# Patient Record
Sex: Male | Born: 1985
Health system: Southern US, Community
[De-identification: ages and names within clinical notes are randomized; demographics above are authoritative.]

## PROBLEM LIST (undated history)

## (undated) ENCOUNTER — Emergency Department (HOSPITAL_COMMUNITY): Payer: BLUE CROSS/BLUE SHIELD | Source: Home / Self Care

## (undated) DIAGNOSIS — K219 Gastro-esophageal reflux disease without esophagitis: Secondary | ICD-10-CM

## (undated) DIAGNOSIS — E349 Endocrine disorder, unspecified: Secondary | ICD-10-CM

## (undated) DIAGNOSIS — K602 Anal fissure, unspecified: Secondary | ICD-10-CM

## (undated) HISTORY — PX: ESOPHAGOGASTRODUODENOSCOPY: SHX1529

## (undated) HISTORY — DX: Anal fissure, unspecified: K60.2

## (undated) HISTORY — DX: Endocrine disorder, unspecified: E34.9

## (undated) HISTORY — DX: Gastro-esophageal reflux disease without esophagitis: K21.9

---

## 2007-03-21 ENCOUNTER — Encounter: Admission: RE | Admit: 2007-03-21 | Discharge: 2007-03-21 | Payer: Self-pay | Admitting: Internal Medicine

## 2008-04-17 ENCOUNTER — Ambulatory Visit (HOSPITAL_BASED_OUTPATIENT_CLINIC_OR_DEPARTMENT_OTHER): Admission: RE | Admit: 2008-04-17 | Discharge: 2008-04-17 | Payer: Self-pay | Admitting: Orthopedic Surgery

## 2011-04-20 NOTE — Op Note (Signed)
[name], Tyler Howe               ACCOUNT NO.:  [id number]   MEDICAL RECORD NO.:  [id number]           PATIENT TYPE:   LOCATION:                                 FACILITY:   PHYSICIAN:  Artist Pais. Weingold, M.D.DATE OF BIRTH:  1986-02-11   DATE OF PROCEDURE:  04/17/2008  DATE OF DISCHARGE:                               OPERATIVE REPORT   PREOPERATIVE DIAGNOSIS:  Right fifth metacarpal fracture.   POSTOPERATIVE DIAGNOSIS:  Right fifth metacarpal fracture.   PROCEDURE:  Open reduction and internal fixation of above with 2.0-mm  lag screws and single #1 as well as 4-hole 2.0-mm plate and 4 screws.   SURGEON:  Artist Pais. Mina Marble, MD   ASSISTANT:  None.   ANESTHESIA:  General.   TOURNIQUET TIME:  48 minutes.   COMPLICATIONS:  None.   DRAINS:  None.   OPERATIVE REPORT:  The patient was taken to the operating suite.  After  induction of adequate general anesthesia, the right upper extremity was  prepped and draped in usual sterile fashion.  An Esmarch was used to  exsanguinate the limb.  Tourniquet was inflated to 250 mmHg.  At this  point in time, a small incision was made at the base of the fifth  metacarpal and iron rod was introduced into the intramedullary canal  from proximal and distal, and was attempted to be driven across the  fracture site.  However, due to severe comminution and angulation, this  was not able to be done.  The incision was then extended longitudinally  over the shaft metacarpal.  Subperiosteal dissection was carried out to  the fracture site and fracture site was debrided off, reduced with  reduction clamp.  Provisional fixation with a 2.0-mm lag screw was  undertaken followed by a 4-hole 2.0-mm plate and 4 screws for final  fixation.  The wound was then thoroughly irrigated, was closed in layer  with a 4-0 Vicryl to the muscle and periosteum, and a 4-0 Vicryl Rapide  suture subcuticularly with Steri-Strips, 4 x 4 fluffs, and a volar  splint was  applied.  Intraoperative x-ray showed good reduction in both  deep and lateral plane.  The patient tolerated the procedure well and  went to recovery room in stable fashion.      Artist Pais Mina Marble, M.D.  Electronically Signed    MAW/MEDQ  D:  05/15/2008  T:  05/16/2008  Job:  [id number]

## 2013-02-06 ENCOUNTER — Other Ambulatory Visit (HOSPITAL_COMMUNITY): Payer: Self-pay | Admitting: Sports Medicine

## 2013-02-06 DIAGNOSIS — M25511 Pain in right shoulder: Secondary | ICD-10-CM

## 2013-02-12 ENCOUNTER — Ambulatory Visit (HOSPITAL_COMMUNITY)
Admission: RE | Admit: 2013-02-12 | Discharge: 2013-02-12 | Disposition: A | Discharge status: Home / Self Care | Payer: BC Managed Care – PPO | Source: Ambulatory Visit | Attending: Sports Medicine | Admitting: Sports Medicine

## 2013-02-12 DIAGNOSIS — M25519 Pain in unspecified shoulder: Secondary | ICD-10-CM | POA: Insufficient documentation

## 2013-02-12 DIAGNOSIS — M25511 Pain in right shoulder: Secondary | ICD-10-CM

## 2013-02-12 DIAGNOSIS — S4980XA Other specified injuries of shoulder and upper arm, unspecified arm, initial encounter: Secondary | ICD-10-CM | POA: Insufficient documentation

## 2013-02-12 DIAGNOSIS — M19019 Primary osteoarthritis, unspecified shoulder: Secondary | ICD-10-CM | POA: Insufficient documentation

## 2013-02-12 DIAGNOSIS — M25569 Pain in unspecified knee: Secondary | ICD-10-CM | POA: Insufficient documentation

## 2013-02-12 DIAGNOSIS — X58XXXA Exposure to other specified factors, initial encounter: Secondary | ICD-10-CM | POA: Insufficient documentation

## 2013-02-12 DIAGNOSIS — S46909A Unspecified injury of unspecified muscle, fascia and tendon at shoulder and upper arm level, unspecified arm, initial encounter: Secondary | ICD-10-CM | POA: Insufficient documentation

## 2013-02-12 MED ORDER — GADOBENATE DIMEGLUMINE 529 MG/ML IV SOLN
5.0000 mL | Freq: Once | INTRAVENOUS | Status: AC | PRN
  Administered 2013-02-12: 0.1 mL via INTRAVENOUS

## 2013-02-12 MED ORDER — IOHEXOL 180 MG/ML  SOLN
20.0000 mL | Freq: Once | INTRAMUSCULAR | Status: AC | PRN
  Administered 2013-02-12: 7 mL via INTRA_ARTICULAR

## 2013-02-12 NOTE — Procedures (Addendum)
*  RADIOLOGY REPORT* Clinical Data: [Weight lifting injury 5 months ago.  Since that time, when the patient placed weight on the shoulder, he experiences pain.] Fluoroscopically guided right glenohumeral joint injection, pre MRI Comparison: [None] Fluoro time:  1.3 minutes of the lowest setting intermittent fluoro. Findings: [I discussed the risks (including hemorrhage and infection, among others), benefits, and alternatives to fluoroscopically guided right glenohumeral joint injection with MRI contrast medium with Mr. Bi.  We discussed the high likelihood of technical success of the procedure.  The patient understood and elected to undergo the procedure. Following sterile skin prep and local anesthetic administration consisting of 1% lidocaine, a 22 gauge spinal needle was advanced without difficulty into the right glenohumeral joint under fluoroscopic guidance.  A total of 12 ml of a mixture of 8 ml lidocaine, 8 ml Omnipaque-300, and 0.1 ml Multihance was then injected into the joint.  The needle was removed and the skin cleansed and bandaged.  No immediate complications were observed.  The patient was taken to MRI for scanning.] IMPRESSION: [ 1.  Technically successful fluoroscopically guided right glenohumeral joint contrast injection, pre MRI.  ADDENDUM:  The contrast utilized was Omnipaque-180, not Omnipaque-300.]

## 2014-11-05 ENCOUNTER — Other Ambulatory Visit: Payer: Self-pay | Admitting: Gastroenterology

## 2014-11-05 DIAGNOSIS — K219 Gastro-esophageal reflux disease without esophagitis: Secondary | ICD-10-CM

## 2014-11-05 DIAGNOSIS — R1012 Left upper quadrant pain: Secondary | ICD-10-CM

## 2014-11-11 ENCOUNTER — Ambulatory Visit
Admission: RE | Admit: 2014-11-11 | Discharge: 2014-11-11 | Disposition: A | Discharge status: Home / Self Care | Payer: BC Managed Care – PPO | Source: Ambulatory Visit | Attending: Gastroenterology | Admitting: Gastroenterology

## 2014-11-11 DIAGNOSIS — R1012 Left upper quadrant pain: Secondary | ICD-10-CM

## 2014-11-11 DIAGNOSIS — K219 Gastro-esophageal reflux disease without esophagitis: Secondary | ICD-10-CM

## 2016-04-08 ENCOUNTER — Telehealth: Payer: Self-pay

## 2016-04-08 ENCOUNTER — Ambulatory Visit (INDEPENDENT_AMBULATORY_CARE_PROVIDER_SITE_OTHER): Payer: BLUE CROSS/BLUE SHIELD | Admitting: Family Medicine

## 2016-04-08 VITALS — BP 122/82 | HR 67 | Temp 98.0°F | Resp 17 | Ht 76.0 in | Wt 235.0 lb

## 2016-04-08 DIAGNOSIS — R5383 Other fatigue: Secondary | ICD-10-CM

## 2016-04-08 DIAGNOSIS — J029 Acute pharyngitis, unspecified: Secondary | ICD-10-CM | POA: Diagnosis not present

## 2016-04-08 LAB — POCT RAPID STREP A (OFFICE): Rapid Strep A Screen: NEGATIVE

## 2016-04-08 MED ORDER — CEPHALEXIN 500 MG PO CAPS
500.0000 mg | ORAL_CAPSULE | Freq: Three times a day (TID) | ORAL | Status: DC
Start: 2016-04-08 — End: 2016-05-31

## 2016-04-08 NOTE — Telephone Encounter (Signed)
Pt has more questions about his visit with dr Milus Glazierlauenstein today he wants to make sure that his diagnosis will not be given to his 66month old  Best number [phone]

## 2016-04-08 NOTE — Patient Instructions (Addendum)
The initial strep test was negative but this is sometimes a false negative. I'm running a backup test which should be back in a couple days. Meantime, you start on Keflex 3 times a day.

## 2016-04-08 NOTE — Progress Notes (Signed)
Is a 30 year old male gentleman who works as a [employer] nearby. He's married and has a 931-month-old son.  He presents today with 3 days of progressive sore throat and fatigue. He added drenching night sweats and first night of his illness.  Patient does not have known fever, cough, intestinal upset  Objective:BP 122/82 mmHg  Pulse 67  Temp(Src) 98 F (36.7 C) (Oral)  Resp 17  Ht 6\' 4"  (1.93 m)  Wt 235 lb (106.595 kg)  BMI 28.62 kg/m2  SpO2 97% Examination of the pharynx reveals diffuse marked erythema with no exudates TMs and canals are normal Neck: Supple without adenopathy Skin: No rash  Results for orders placed or performed in visit on 04/08/16  POCT rapid strep A  Result Value Ref Range   Rapid Strep A Screen Negative Negative   Assessment: Patient has significant pharyngitis. This may represent a strep infection and we need to rule out a backup test since the rapid test to sometimes erroneous.  Plan: Throat culture, Keflex 500 3 times a day in the meantime  Signed, Elvina SidleKurt Lya Holben M.D.

## 2016-04-10 LAB — CULTURE, GROUP A STREP: Organism ID, Bacteria: NORMAL

## 2016-05-31 ENCOUNTER — Ambulatory Visit (INDEPENDENT_AMBULATORY_CARE_PROVIDER_SITE_OTHER): Payer: BLUE CROSS/BLUE SHIELD | Admitting: Family Medicine

## 2016-05-31 VITALS — BP 122/72 | HR 86 | Temp 98.2°F | Resp 17 | Ht 76.5 in | Wt 236.0 lb

## 2016-05-31 DIAGNOSIS — J019 Acute sinusitis, unspecified: Secondary | ICD-10-CM

## 2016-05-31 DIAGNOSIS — J069 Acute upper respiratory infection, unspecified: Secondary | ICD-10-CM

## 2016-05-31 MED ORDER — AMOXICILLIN-POT CLAVULANATE 875-125 MG PO TABS: 1.0000 | ORAL_TABLET | Freq: Two times a day (BID) | ORAL | Status: DC

## 2016-05-31 NOTE — Progress Notes (Signed)
Subjective:  By signing my name below, I, Tyler Howe, attest that this documentation has been prepared under the direction and in the presence of Meredith StaggersJeffrey Mauria Asquith, MD.  Electronically Signed: Andrew Auaven Howe, ED Scribe. 05/31/2016. 10:06 AM.  Patient ID: Tyler Howe, male    DOB: January 28, 1986, 30 y.o.   MRN: [id number]  HPI   Chief Complaint  Patient presents with  . Sinusitis  . Nasal Congestion    HPI Comments: Tyler Howe is a 30 y.o. male who presents to the [facility] complaining of sinus congestion that began 2 days ago. Pt reports initial symptom of sore throat 4 days ago that resolved 2 days later. He developed green nasal drainage along with sinus pressure and night sweats 2 days ago, but no measured fever. Pt has tried OTC cold and sinus medication and saline spray as needed. Trying to consume plenty of food and fluids. He has sick contacts at home of his wife as well as a 4 m.o who also has a cough and congestion. Pt plans to go to the beach at the end of the week. Pt denies abx allergies.   Pt also notes left calf pain for the past 5 years.  He used to be a runner but has stopped due to calf cramping. He has been followed by podiatrist and PT in the past but problem has not resolved.   Pt works at a [employer]. 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   There are no active problems to display for this patient.  No past medical history on file. No past surgical history on file. No Known Allergies Prior to Admission medications   Not on File   Social History   Social History  . Marital Status: Married    Spouse Name: N/A  . Number of Children: N/A  . Years of Education: N/A   Occupational History  . Not on file.   Social History Main Topics  . Smoking status: Never Smoker   . Smokeless tobacco: Not on file  . Alcohol Use: No  . Drug Use: No  . Sexual Activity: Not on file   Other Topics Concern  . Not on file   Social History Narrative     Review of Systems  Constitutional: Positive for diaphoresis. Negative for fever, chills and appetite change.  HENT: Positive for congestion, sinus pressure and sore throat (initial, resolved).        Objective:   Physical Exam  Constitutional: He is oriented to person, place, and time. He appears well-developed and well-nourished.  HENT:  Head: Normocephalic and atraumatic.  Right Ear: Tympanic membrane, external ear and ear canal normal.  Left Ear: Tympanic membrane, external ear and ear canal normal.  Nose: No rhinorrhea. Right sinus exhibits no maxillary sinus tenderness and no frontal sinus tenderness. Left sinus exhibits no maxillary sinus tenderness and no frontal sinus tenderness.  Mouth/Throat: Oropharynx is clear and moist and mucous membranes are normal. No oropharyngeal exudate or posterior oropharyngeal erythema.  Eyes: Conjunctivae are normal. Pupils are equal, round, and reactive to light.  Neck: Neck supple.  Cardiovascular: Normal rate, regular rhythm, normal heart sounds and intact distal pulses.   No murmur heard. Pulmonary/Chest: Effort normal and breath sounds normal. He has no wheezes. He has no rhonchi. He has no rales.  Abdominal: Soft. There is no tenderness.  Lymphadenopathy:    He has no cervical adenopathy.  Neurological: He is alert and oriented to  person, place, and time.  Skin: Skin is warm and dry. No rash noted.  Psychiatric: He has a normal mood and affect. His behavior is normal.  Vitals reviewed.  Filed Vitals:   05/31/16 0951  BP: 122/72  Pulse: 86  Temp: 98.2 F (36.8 C)  TempSrc: Oral  Resp: 17  Height: 6' 4.5" (1.943 m)  Weight: 236 lb (107.049 kg)  SpO2: 96%    Assessment & Plan:   Tyler Howe is a 30 y.o. male Acute upper respiratory infection - Plan: amoxicillin-clavulanate (AUGMENTIN) 875-125 MG tablet  Acute sinusitis, recurrence not specified, unspecified location - Plan: amoxicillin-clavulanate (AUGMENTIN) 875-125 MG  tablet  Suspected viral illness with sick contacts. Less likely early sinusitis. Symptomatic care discussed with saline nasal spray, Augmentin prescription given if needed and not improving this week. Side effects discussed. RTC precautions.  Recurrent foot/calf pain. Plan on following this up at next visit to determine sx's and what has been done to this point in treatment. Consider Sports Medicine Eval based on chronicity of symptoms. We'll discuss further next visit.   Meds ordered this encounter  Medications  . amoxicillin-clavulanate (AUGMENTIN) 875-125 MG tablet    Sig: Take 1 tablet by mouth 2 (two) times daily.    Dispense:  20 tablet    Refill:  0   Patient Instructions       IF you received an x-ray today, you will receive an invoice from [facility]. Please contact [facility] at [phone] with questions or concerns regarding your invoice.   IF you received labwork today, you will receive an invoice from [employer]. Please contact [facility] at [phone] with questions or concerns regarding your invoice.   Our billing staff will not be able to assist you with questions regarding bills from these companies.  You will be contacted with the lab results as soon as they are available. The fastest way to get your results is to activate your My Chart account. Instructions are located on the last page of this paperwork. If you have not heard from [place] regarding the results in 2 weeks, please contact this office.     You likely still have a viral infection. Continue the saline nasal spray, decongestant over the counter as needed. If not improving this week, you can start the Augmentin as discussed.  Return to the clinic or go to the nearest emergency room if any of your symptoms worsen or new symptoms occur.  Upper Respiratory Infection, Adult Most upper respiratory infections (URIs) are a viral infection of the air passages leading  to the lungs. A URI affects the nose, throat, and upper air passages. The most common type of URI is nasopharyngitis and is typically referred to as "the common cold." URIs run their course and usually go away on their own. Most of the time, a URI does not require medical attention, but sometimes a bacterial infection in the upper airways can follow a viral infection. This is called a secondary infection. Sinus and middle ear infections are common types of secondary upper respiratory infections. Bacterial pneumonia can also complicate a URI. A URI can worsen asthma and chronic obstructive pulmonary disease (COPD). Sometimes, these complications can require emergency medical care and may be life threatening.  CAUSES Almost all URIs are caused by viruses. A virus is a type of germ and can spread from one person to another.  RISKS FACTORS You may be at risk for a URI if:   You smoke.  You have chronic heart or lung disease.  You have a weakened defense (immune) system.   You are very young or very old.   You have nasal allergies or asthma.  You work in crowded or poorly ventilated areas.  You work in health care facilities or schools. SIGNS AND SYMPTOMS  Symptoms typically develop 2-3 days after you come in contact with a cold virus. Most viral URIs last 7-10 days. However, viral URIs from the influenza virus (flu virus) can last 14-18 days and are typically more severe. Symptoms may include:   Runny or stuffy (congested) nose.   Sneezing.   Cough.   Sore throat.   Headache.   Fatigue.   Fever.   Loss of appetite.   Pain in your forehead, behind your eyes, and over your cheekbones (sinus pain).  Muscle aches.  DIAGNOSIS  Your health care provider may diagnose a URI by:  Physical exam.  Tests to check that your symptoms are not due to another condition such as:  Strep throat.  Sinusitis.  Pneumonia.  Asthma. TREATMENT  A URI goes away on its own with  time. It cannot be cured with medicines, but medicines may be prescribed or recommended to relieve symptoms. Medicines may help:  Reduce your fever.  Reduce your cough.  Relieve nasal congestion. HOME CARE INSTRUCTIONS   Take medicines only as directed by your health care provider.   Gargle warm saltwater or take cough drops to comfort your throat as directed by your health care provider.  Use a warm mist humidifier or inhale steam from a shower to increase air moisture. This may make it easier to breathe.  Drink enough fluid to keep your urine clear or pale yellow.   Eat soups and other clear broths and maintain good nutrition.   Rest as needed.   Return to work when your temperature has returned to normal or as your health care provider advises. You may need to stay home longer to avoid infecting others. You can also use a face mask and careful hand washing to prevent spread of the virus.  Increase the usage of your inhaler if you have asthma.   Do not use any tobacco products, including cigarettes, chewing tobacco, or electronic cigarettes. If you need help quitting, ask your health care provider. PREVENTION  The best way to protect yourself from getting a cold is to practice good hygiene.   Avoid oral or hand contact with people with cold symptoms.   Wash your hands often if contact occurs.  There is no clear evidence that vitamin C, vitamin E, echinacea, or exercise reduces the chance of developing a cold. However, it is always recommended to get plenty of rest, exercise, and practice good nutrition.  SEEK MEDICAL CARE IF:   You are getting worse rather than better.   Your symptoms are not controlled by medicine.   You have chills.  You have worsening shortness of breath.  You have brown or red mucus.  You have yellow or brown nasal discharge.  You have pain in your face, especially when you bend forward.  You have a fever.  You have swollen neck  glands.  You have pain while swallowing.  You have white areas in the back of your throat. SEEK IMMEDIATE MEDICAL CARE IF:   You have severe or persistent:  Headache.  Ear pain.  Sinus pain.  Chest pain.  You have chronic lung disease and any of the following:  Wheezing.  Prolonged cough.  Coughing up blood.  A change in your usual mucus.  You have a stiff neck.  You have changes in your:  Vision.  Hearing.  Thinking.  Mood. MAKE SURE YOU:   Understand these instructions.  Will watch your condition.  Will get help right away if you are not doing well or get worse.   This information is not intended to replace advice given to you by your health care provider. Make sure you discuss any questions you have with your health care provider.   Document Released: 05/18/2001 Document Revised: 04/08/2015 Document Reviewed: 02/27/2014 Elsevier Interactive Patient Education 2016 Elsevier Inc. Sinusitis, Adult Sinusitis is redness, soreness, and inflammation of the paranasal sinuses. Paranasal sinuses are air pockets within the bones of your face. They are located beneath your eyes, in the middle of your forehead, and above your eyes. In healthy paranasal sinuses, mucus is able to drain out, and air is able to circulate through them by way of your nose. However, when your paranasal sinuses are inflamed, mucus and air can become trapped. This can allow bacteria and other germs to grow and cause infection. Sinusitis can develop quickly and last only a short time (acute) or continue over a long period (chronic). Sinusitis that lasts for more than 12 weeks is considered chronic. CAUSES Causes of sinusitis include:  Allergies.  Structural abnormalities, such as displacement of the cartilage that separates your nostrils (deviated septum), which can decrease the air flow through your nose and sinuses and affect sinus drainage.  Functional abnormalities, such as when the Howe  hairs (cilia) that line your sinuses and help remove mucus do not work properly or are not present. SIGNS AND SYMPTOMS Symptoms of acute and chronic sinusitis are the same. The primary symptoms are pain and pressure around the affected sinuses. Other symptoms include:  Upper toothache.  Earache.  Headache.  Bad breath.  Decreased sense of smell and taste.  A cough, which worsens when you are lying flat.  Fatigue.  Fever.  Thick drainage from your nose, which often is green and may contain pus (purulent).  Swelling and warmth over the affected sinuses. DIAGNOSIS Your health care provider will perform a physical exam. During your exam, your health care provider may perform any of the following to help determine if you have acute sinusitis or chronic sinusitis:  Look in your nose for signs of abnormal growths in your nostrils (nasal polyps).  Tap over the affected sinus to check for signs of infection.  View the inside of your sinuses using an imaging device that has a light attached (endoscope). If your health care provider suspects that you have chronic sinusitis, one or more of the following tests may be recommended:  Allergy tests.  Nasal culture. A sample of mucus is taken from your nose, sent to a lab, and screened for bacteria.  Nasal cytology. A sample of mucus is taken from your nose and examined by your health care provider to determine if your sinusitis is related to an allergy. TREATMENT Most cases of acute sinusitis are related to a viral infection and will resolve on their own within 10 days. Sometimes, medicines are prescribed to help relieve symptoms of both acute and chronic sinusitis. These may include pain medicines, decongestants, nasal steroid sprays, or saline sprays. However, for sinusitis related to a bacterial infection, your health care provider will prescribe antibiotic medicines. These are medicines that will help kill the bacteria causing the  infection. Rarely, sinusitis is caused by a  fungal infection. In these cases, your health care provider will prescribe antifungal medicine. For some cases of chronic sinusitis, surgery is needed. Generally, these are cases in which sinusitis recurs more than 3 times per year, despite other treatments. HOME CARE INSTRUCTIONS  Drink plenty of water. Water helps thin the mucus so your sinuses can drain more easily.  Use a humidifier.  Inhale steam 3-4 times a day (for example, sit in the bathroom with the shower running).  Apply a warm, moist washcloth to your face 3-4 times a day, or as directed by your health care provider.  Use saline nasal sprays to help moisten and clean your sinuses.  Take medicines only as directed by your health care provider.  If you were prescribed either an antibiotic or antifungal medicine, finish it all even if you start to feel better. SEEK IMMEDIATE MEDICAL CARE IF:  You have increasing pain or severe headaches.  You have nausea, vomiting, or drowsiness.  You have swelling around your face.  You have vision problems.  You have a stiff neck.  You have difficulty breathing.   This information is not intended to replace advice given to you by your health care provider. Make sure you discuss any questions you have with your health care provider.   Document Released: 11/22/2005 Document Revised: 12/13/2014 Document Reviewed: 12/07/2011 Elsevier Interactive Patient Education [employer]2016 Elsevier Inc.     I personally performed the services described in this documentation, which was scribed in my presence. The recorded information has been reviewed and considered, and addended by me as needed.   Signed,   Meredith StaggersJeffrey Ramez Arrona, MD [facility].  05/31/2016 10:27 AM

## 2016-05-31 NOTE — Patient Instructions (Addendum)
IF you received an x-ray today, you will receive an invoice from [facility]. Please contact [facility] at [phone] with questions or concerns regarding your invoice.   IF you received labwork today, you will receive an invoice from [facility]. Please contact [facility] at [phone] with questions or concerns regarding your invoice.   Our billing staff will not be able to assist you with questions regarding bills from these companies.  You will be contacted with the lab results as soon as they are available. The fastest way to get your results is to activate your My Chart account. Instructions are located on the last page of this paperwork. If you have not heard from us regarding the results in 2 weeks, please contact this office.     You likely still have a viral infection. Continue the saline nasal spray, decongestant over the counter as needed. If not improving this week, you can start the Augmentin as discussed.  Return to the clinic or go to the nearest emergency room if any of your symptoms worsen or new symptoms occur.  Upper Respiratory Infection, Adult Most upper respiratory infections (URIs) are a viral infection of the air passages leading to the lungs. A URI affects the nose, throat, and upper air passages. The most common type of URI is nasopharyngitis and is typically referred to as "the common cold." URIs run their course and usually go away on their own. Most of the time, a URI does not require medical attention, but sometimes a bacterial infection in the upper airways can follow a viral infection. This is called a secondary infection. Sinus and middle ear infections are common types of secondary upper respiratory infections. Bacterial pneumonia can also complicate a URI. A URI can worsen asthma and chronic obstructive pulmonary disease (COPD). Sometimes, these complications can require emergency medical care and may be life  threatening.  CAUSES Almost all URIs are caused by viruses. A virus is a type of germ and can spread from one person to another.  RISKS FACTORS You may be at risk for a URI if:   You smoke.   You have chronic heart or lung disease.  You have a weakened defense (immune) system.   You are very young or very old.   You have nasal allergies or asthma.  You work in crowded or poorly ventilated areas.  You work in health care facilities or schools. SIGNS AND SYMPTOMS  Symptoms typically develop 2-3 days after you come in contact with a cold virus. Most viral URIs last 7-10 days. However, viral URIs from the influenza virus (flu virus) can last 14-18 days and are typically more severe. Symptoms may include:   Runny or stuffy (congested) nose.   Sneezing.   Cough.   Sore throat.   Headache.   Fatigue.   Fever.   Loss of appetite.   Pain in your forehead, behind your eyes, and over your cheekbones (sinus pain).  Muscle aches.  DIAGNOSIS  Your health care provider may diagnose a URI by:  Physical exam.  Tests to check that your symptoms are not due to another condition such as:  Strep throat.  Sinusitis.  Pneumonia.  Asthma. TREATMENT  A URI goes away on its own with time. It cannot be cured with medicines, but medicines may be prescribed or recommended to relieve symptoms. Medicines may help:  Reduce your fever.  Reduce your cough.  Relieve nasal congestion. HOME CARE INSTRUCTIONS   Take medicines  only as directed by your health care provider.   Gargle warm saltwater or take cough drops to comfort your throat as directed by your health care provider.  Use a warm mist humidifier or inhale steam from a shower to increase air moisture. This may make it easier to breathe.  Drink enough fluid to keep your urine clear or pale yellow.   Eat soups and other clear broths and maintain good nutrition.   Rest as needed.   Return to work when  your temperature has returned to normal or as your health care provider advises. You may need to stay home longer to avoid infecting others. You can also use a face mask and careful hand washing to prevent spread of the virus.  Increase the usage of your inhaler if you have asthma.   Do not use any tobacco products, including cigarettes, chewing tobacco, or electronic cigarettes. If you need help quitting, ask your health care provider. PREVENTION  The best way to protect yourself from getting a cold is to practice good hygiene.   Avoid oral or hand contact with people with cold symptoms.   Wash your hands often if contact occurs.  There is no clear evidence that vitamin C, vitamin E, echinacea, or exercise reduces the chance of developing a cold. However, it is always recommended to get plenty of rest, exercise, and practice good nutrition.  SEEK MEDICAL CARE IF:   You are getting worse rather than better.   Your symptoms are not controlled by medicine.   You have chills.  You have worsening shortness of breath.  You have brown or red mucus.  You have yellow or brown nasal discharge.  You have pain in your face, especially when you bend forward.  You have a fever.  You have swollen neck glands.  You have pain while swallowing.  You have white areas in the back of your throat. SEEK IMMEDIATE MEDICAL CARE IF:   You have severe or persistent:  Headache.  Ear pain.  Sinus pain.  Chest pain.  You have chronic lung disease and any of the following:  Wheezing.  Prolonged cough.  Coughing up blood.  A change in your usual mucus.  You have a stiff neck.  You have changes in your:  Vision.  Hearing.  Thinking.  Mood. MAKE SURE YOU:   Understand these instructions.  Will watch your condition.  Will get help right away if you are not doing well or get worse.   This information is not intended to replace advice given to you by your health care  provider. Make sure you discuss any questions you have with your health care provider.   Document Released: 05/18/2001 Document Revised: 04/08/2015 Document Reviewed: 02/27/2014 Elsevier Interactive Patient Education 2016 Elsevier Inc. Sinusitis, Adult Sinusitis is redness, soreness, and inflammation of the paranasal sinuses. Paranasal sinuses are air pockets within the bones of your face. They are located beneath your eyes, in the middle of your forehead, and above your eyes. In healthy paranasal sinuses, mucus is able to drain out, and air is able to circulate through them by way of your nose. However, when your paranasal sinuses are inflamed, mucus and air can become trapped. This can allow bacteria and other germs to grow and cause infection. Sinusitis can develop quickly and last only a short time (acute) or continue over a long period (chronic). Sinusitis that lasts for more than 12 weeks is considered chronic. CAUSES Causes of sinusitis include:  Allergies.  Structural abnormalities, such as displacement of the cartilage that separates your nostrils (deviated septum), which can decrease the air flow through your nose and sinuses and affect sinus drainage.  Functional abnormalities, such as when the small hairs (cilia) that line your sinuses and help remove mucus do not work properly or are not present. SIGNS AND SYMPTOMS Symptoms of acute and chronic sinusitis are the same. The primary symptoms are pain and pressure around the affected sinuses. Other symptoms include:  Upper toothache.  Earache.  Headache.  Bad breath.  Decreased sense of smell and taste.  A cough, which worsens when you are lying flat.  Fatigue.  Fever.  Thick drainage from your nose, which often is green and may contain pus (purulent).  Swelling and warmth over the affected sinuses. DIAGNOSIS Your health care provider will perform a physical exam. During your exam, your health care provider may perform  any of the following to help determine if you have acute sinusitis or chronic sinusitis:  Look in your nose for signs of abnormal growths in your nostrils (nasal polyps).  Tap over the affected sinus to check for signs of infection.  View the inside of your sinuses using an imaging device that has a light attached (endoscope). If your health care provider suspects that you have chronic sinusitis, one or more of the following tests may be recommended:  Allergy tests.  Nasal culture. A sample of mucus is taken from your nose, sent to a lab, and screened for bacteria.  Nasal cytology. A sample of mucus is taken from your nose and examined by your health care provider to determine if your sinusitis is related to an allergy. TREATMENT Most cases of acute sinusitis are related to a viral infection and will resolve on their own within 10 days. Sometimes, medicines are prescribed to help relieve symptoms of both acute and chronic sinusitis. These may include pain medicines, decongestants, nasal steroid sprays, or saline sprays. However, for sinusitis related to a bacterial infection, your health care provider will prescribe antibiotic medicines. These are medicines that will help kill the bacteria causing the infection. Rarely, sinusitis is caused by a fungal infection. In these cases, your health care provider will prescribe antifungal medicine. For some cases of chronic sinusitis, surgery is needed. Generally, these are cases in which sinusitis recurs more than 3 times per year, despite other treatments. HOME CARE INSTRUCTIONS  Drink plenty of water. Water helps thin the mucus so your sinuses can drain more easily.  Use a humidifier.  Inhale steam 3-4 times a day (for example, sit in the bathroom with the shower running).  Apply a warm, moist washcloth to your face 3-4 times a day, or as directed by your health care provider.  Use saline nasal sprays to help moisten and clean your  sinuses.  Take medicines only as directed by your health care provider.  If you were prescribed either an antibiotic or antifungal medicine, finish it all even if you start to feel better. SEEK IMMEDIATE MEDICAL CARE IF:  You have increasing pain or severe headaches.  You have nausea, vomiting, or drowsiness.  You have swelling around your face.  You have vision problems.  You have a stiff neck.  You have difficulty breathing.   This information is not intended to replace advice given to you by your health care provider. Make sure you discuss any questions you have with your health care provider.   Document Released: 11/22/2005 Document Revised: 12/13/2014 Document Reviewed: 12/07/2011 Elsevier  Interactive Patient Education 2016 Elsevier Inc.  

## 2016-07-10 DIAGNOSIS — B001 Herpesviral vesicular dermatitis: Secondary | ICD-10-CM | POA: Diagnosis not present

## 2016-11-14 DIAGNOSIS — Z23 Encounter for immunization: Secondary | ICD-10-CM | POA: Diagnosis not present

## 2017-01-27 ENCOUNTER — Ambulatory Visit (INDEPENDENT_AMBULATORY_CARE_PROVIDER_SITE_OTHER): Payer: BLUE CROSS/BLUE SHIELD | Admitting: Sports Medicine

## 2017-01-27 ENCOUNTER — Ambulatory Visit: Payer: Self-pay

## 2017-01-27 ENCOUNTER — Encounter: Payer: Self-pay | Admitting: Sports Medicine

## 2017-01-27 ENCOUNTER — Other Ambulatory Visit: Payer: Self-pay | Admitting: *Deleted

## 2017-01-27 VITALS — BP 119/77 | HR 66 | Ht 76.0 in | Wt 225.0 lb

## 2017-01-27 DIAGNOSIS — S86012A Strain of left Achilles tendon, initial encounter: Secondary | ICD-10-CM

## 2017-01-27 DIAGNOSIS — M7660 Achilles tendinitis, unspecified leg: Secondary | ICD-10-CM | POA: Diagnosis not present

## 2017-01-27 DIAGNOSIS — M6788 Other specified disorders of synovium and tendon, other site: Secondary | ICD-10-CM

## 2017-01-27 DIAGNOSIS — M79662 Pain in left lower leg: Secondary | ICD-10-CM | POA: Diagnosis not present

## 2017-01-27 MED ORDER — NITROGLYCERIN 0.2 MG/HR TD PT24: MEDICATED_PATCH | TRANSDERMAL | 1 refills | Status: DC

## 2017-01-27 NOTE — Patient Instructions (Addendum)
Bring shoes in for inserts and heel lift Do heel exercises  Follow-up in 4-6 weeks  Nitroglycerin Protocol  Apply 1/4 nitroglycerin patch to affected area daily.  Change position of patch within the affected area every 24 hours.  You may experience a headache during the first 1-2 weeks of using the patch, these should subside.  If you experience headaches after beginning nitroglycerin patch treatment, you may take your preferred over the counter pain reliever.  Another side effect of the nitroglycerin patch is skin irritation or rash related to patch adhesive.  Please notify our office if you develop more severe headaches or rash, and stop the patch.  Tendon healing with nitroglycerin patch may require 12 to 24 weeks depending on the extent of injury.  Men should not use if taking Viagra, Cialis, or Levitra.   Do not use if you have migraines or rosacea.     Partial (Incomplete) Achilles Tendon Rupture An Achilles tendon rupture is an injury in which the tough, cord-like band that attaches the lower muscles of your leg to your heel (Achilles tendon) tears (ruptures). In a partial Achilles tendon rupture, surgery may not be needed. CAUSES  A tendon may rupture if it is weakened or weakening (degenerative) and a sudden stress is applied to it. Weakening or degeneration of a tendon may be caused by:  Recurrent injuries, such as those causing Achilles tendonitis.  Damaged tendons.  Aging.  Vascular disease of the tendon. SIGNS AND SYMPTOMS  Feeling as if you were struck violently in the back of the ankle.  Hearing a "pop" and experiencing severe, sudden (acute) pain; however, the absence of pain does not mean there was not a rupture. DIAGNOSIS A physical exam is usually all that is needed to diagnose an Achilles tendon rupture. During the exam, your health care provider will touch the tendon and the structures around it. You may be asked to lie on your stomach or kneel on a  chair while your health care provider squeezes your calf muscle. You most likely have a ruptured tendon if your foot does not flex.  Sometimes tests are performed. These may include:  Ultrasonography. This allows quick confirmation of the diagnosis.

## 2017-01-27 NOTE — Progress Notes (Signed)
Tyler RubensteinMichael C Howe - 31 y.o. male MRN [id number]  Date of birth: May 09, 1986  SUBJECTIVE:   CC: Left leg Pain  HPI: Patient presents with complaints of left calf tightness, achilles pain, and intermittent plantar fascitis. He states that he ahs been having calf tightness for over 4 years. It started off as a cramp and has been tight ever since. This later turned into foot pain. He went to go see a podiatrist diagnosed him with plantar fasciitis. He received injections into the plantar fascia but still wasn't having relief. He eventually went to physical therapy where they did try needling. This result is plantar fasciitis. He stopped going about 2 years ago.  Most recently he has been having achilles pain on the left. Current injury occurred May 2017 when he was running with his son up a hill. He feels like he popped something in his Achilles. States that he had swelling several days later. Achilles has not been the same since.  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 Now has not run for the last 5-6 years but would like to get back to running.  ROS per HPI.    HISTORY: Past Medical, Surgical, Social, and Family History Reviewed & Updated per EMR.   Pertinent Historical Findings include: None  PHYSICAL EXAM:  BP 119/77   Pulse 66   Ht 6\' 4"  (1.93 m)   Wt 225 lb (102.1 kg)   BMI 27.39 kg/m   General: alert, well-appearing, NAD Neurologic: No focal deficits, gait normal, A&Ox3.  Skin: Intact without suspicious lesions or rashes. Warm and dry. Psych: Mood and affect are normal Pulses: DP/PT are full and equal bilaterally.  Extremities: No cyanosis, clubbing, edema L Ankle: Visible swelling over the achilles tendon. No erythema. Range of motion is full in all directions. Strength is 5/5 in all directions. Stable lateral and medial ligaments; Anterior drawer neg; neg talar tilt No tenderness to palpation over malleolus Tender achilles tendon on  left Neg Hagland deformity Negative Thompson test with positive plantar flexion Able to walk 4 steps. Able to heel and toe walk  Limited MSK US performed on left Achilles tendon: Left achilles tendon with marked thickness (1.04cm) compared to right (0.4cm). Short view axis also with similar findings. Can also see two tears in left achilles tendon with about 20% involvement in the achilles fibers.    ASSESSMENT & PLAN:   1. Achilles tendinosis, Left Chronic achilles tendinopathy present for about a year. Marked swelling appreciated in achilles tendon on left. Findings consistent on US. Will treat patient with shoe inserts and heel lifts. Will also start nitroglycerin protocol to increase blood flow and help with remodeling tendon. Patient also given eccentric heel exercises.   2. Partial tear of left Achilles tendon, initial encounter About 20% of fibers torn on US. Can see two areas of distinct tear. Will treat was above.   3. Pain of left calf Calf tightness that patient endorses could be referred pain from his achilles. All of the muscles and tendons on his left for which he is endorsing pain are connected. Will first start with treating achilles and then work on calf tightness.  - US LIMITED JOINT SPACE STRUCTURES LOW LEFT; Future   Caryl AdaJazma Chonda Baney, DO 01/27/2017, 10:20 AM PGY-3, [facility]  Patient seen and evaluated with the resident. I agree with the above plan of care. Patient has clinical and ultrasound evidence of chronic left Achilles tendinopathy with  partial tearing. We will start the topical nitroglycerin protocol and he will start daily eccentric exercises. I've also given him some heel lifts to wear in his shoes. Patient will follow-up with me in 4 weeks for repeat ultrasound. I explained to him that these types of injuries may take up to 6 months before resolving. He will call with questions or concerns prior to his follow-up visit.

## 2017-03-03 ENCOUNTER — Ambulatory Visit: Payer: BLUE CROSS/BLUE SHIELD | Admitting: Sports Medicine

## 2017-04-22 ENCOUNTER — Other Ambulatory Visit: Payer: Self-pay | Admitting: Physician Assistant

## 2017-04-22 ENCOUNTER — Ambulatory Visit (HOSPITAL_COMMUNITY)
Admission: RE | Admit: 2017-04-22 | Discharge: 2017-04-22 | Disposition: A | Discharge status: Home / Self Care | Payer: BLUE CROSS/BLUE SHIELD | Source: Ambulatory Visit | Attending: Physician Assistant | Admitting: Physician Assistant

## 2017-04-22 ENCOUNTER — Ambulatory Visit (INDEPENDENT_AMBULATORY_CARE_PROVIDER_SITE_OTHER): Payer: BLUE CROSS/BLUE SHIELD | Admitting: Physician Assistant

## 2017-04-22 ENCOUNTER — Encounter: Payer: Self-pay | Admitting: Physician Assistant

## 2017-04-22 VITALS — BP 119/78 | HR 69 | Temp 98.2°F | Resp 18 | Ht 76.97 in | Wt 227.0 lb

## 2017-04-22 DIAGNOSIS — N433 Hydrocele, unspecified: Secondary | ICD-10-CM

## 2017-04-22 DIAGNOSIS — N50812 Left testicular pain: Secondary | ICD-10-CM | POA: Diagnosis not present

## 2017-04-22 DIAGNOSIS — N50819 Testicular pain, unspecified: Secondary | ICD-10-CM

## 2017-04-22 LAB — POCT URINALYSIS DIP (MANUAL ENTRY)
BILIRUBIN UA: NEGATIVE
BILIRUBIN UA: NEGATIVE mg/dL
Blood, UA: NEGATIVE
Glucose, UA: NEGATIVE mg/dL
LEUKOCYTES UA: NEGATIVE
Nitrite, UA: NEGATIVE
Spec Grav, UA: 1.025 (ref 1.010–1.025)
Urobilinogen, UA: 0.2 E.U./dL
pH, UA: 6 (ref 5.0–8.0)

## 2017-04-22 NOTE — Patient Instructions (Addendum)
   [facility] [address]  IF you received an x-ray today, you will receive an invoice from [facility]. Please contact [facility] at [phone] with questions or concerns regarding your invoice.   IF you received labwork today, you will receive an invoice from [facility]. Please contact [facility] at [phone] with questions or concerns regarding your invoice.   Our billing staff will not be able to assist you with questions regarding bills from these companies.  You will be contacted with the lab results as soon as they are available. The fastest way to get your results is to activate your My Chart account. Instructions are located on the last page of this paperwork. If you have not heard from us regarding the results in 2 weeks, please contact this office.

## 2017-04-22 NOTE — Progress Notes (Signed)
04/22/2017 4:20 PM   DOB: 12/27/1985 / MRN: [id number]  SUBJECTIVE:  Tyler Howe is a 31 y.o. male presenting for 5-6/10 tightness of "of both balls" that is relieved with manually riases his testicles and this seems to help.  deneis dysuria, frequncy, hematuria, truama. Denies new sexual partners.  Has not tried anything for the pain. Denies difficulty with defecation and go three times daily and this is not changed. Denies rash.    He has No Known Allergies.   He  has no past medical history on file.    He  reports that he has never smoked. He has never used smokeless tobacco. He reports that he does not drink alcohol or use drugs. He  has no sexual activity history on file. The patient  has no past surgical history on file.  His family history is not on file.  Review of Systems  Constitutional: Negative for chills, fever and weight loss.  Respiratory: Negative for cough.   Cardiovascular: Negative for chest pain.  Genitourinary: Negative for dysuria, flank pain, frequency, hematuria and urgency.  Skin: Negative for rash.    The problem list and medications were reviewed and updated by myself where necessary and exist elsewhere in the encounter.   OBJECTIVE:  BP 119/78   Pulse 69   Temp 98.2 F (36.8 C) (Oral)   Resp 18   Ht 6' 4.97" (1.955 m)   Wt 227 lb (103 kg)   SpO2 98%   BMI 26.94 kg/m   Physical Exam  Constitutional: He is oriented to person, place, and time.  Cardiovascular: Normal rate and regular rhythm.   Pulmonary/Chest: Effort normal and breath sounds normal.  Abdominal: A hernia is present. Hernia confirmed positive in the left inguinal area (with valsalva only).  Genitourinary: Penis normal. Right testis shows no mass, no swelling and no tenderness. Right testis is descended. Cremasteric reflex is not absent on the right side. Left testis shows no mass, no swelling and no tenderness. Left testis is descended. Cremasteric reflex is not absent on the left  side. Circumcised. No discharge found.  Musculoskeletal: Normal range of motion.  Neurological: He is alert and oriented to person, place, and time.  Vitals reviewed.   Results for orders placed or performed in visit on 04/22/17 (from the past 72 hour(s))  POCT urinalysis dipstick     Status: Abnormal   Collection Time: 04/22/17  3:42 PM  Result Value Ref Range   Color, UA yellow yellow   Clarity, UA clear clear   Glucose, UA negative negative mg/dL   Bilirubin, UA negative negative   Ketones, POC UA negative negative mg/dL   Spec Grav, UA 4.098 1.191 - 1.025   Blood, UA negative negative   pH, UA 6.0 5.0 - 8.0   Protein Ur, POC trace (A) negative mg/dL   Urobilinogen, UA 0.2 0.2 or 1.0 E.U./dL   Nitrite, UA Negative Negative   Leukocytes, UA Negative Negative    No results found.  ASSESSMENT AND PLAN:  Elijha was seen today for testicle pain.  Diagnoses and all orders for this visit:  Testicular pain: Palpable on valsalva only.  Will advise that he see general surgery.  [place] pending.  -     US Scrotum; Future -     [place] Art/Ven Flow Abd Pelv Doppler; Future -     POCT urinalysis dipstick  Direct inguinal hernia: Questionable per ultrasound.  Some microcalicifications found necessitates urologic referral and they will be able to  surgically fixate if they confirm hernia on exam.  Also found to have multiple hydroceles.  -  AMB urology    The patient is advised to call or return to clinic if he does not see an improvement in symptoms, or to seek the care of the closest emergency department if he worsens with the above plan.   Deliah BostonMichael Clark, MHS, PA-C [facility] 04/22/2017 4:20 PM

## 2017-05-29 IMAGING — US US PELVIS LIMITED
1 series · 13 of 25 positions shown · non-contrast
Comparison: None available.

CLINICAL DATA: Initial evaluation for acute left testicular pain
for 1 day.

EXAM:
SCROTAL ULTRASOUND
DOPPLER ULTRASOUND OF THE TESTICLES
TECHNIQUE: Complete ultrasound examination of the testicles, epididymis, and
other scrotal structures was performed. Color and spectral Doppler
ultrasound were also utilized to evaluate blood flow to the
testicles.

[Series 1: us pelvis limited · 0.06mm/px · 13 of 62 slices shown]
[im 1/62]
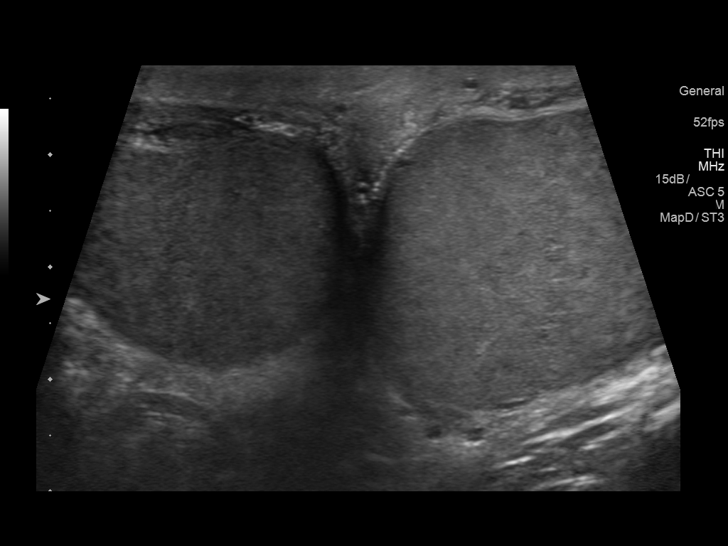
[im 6/62]
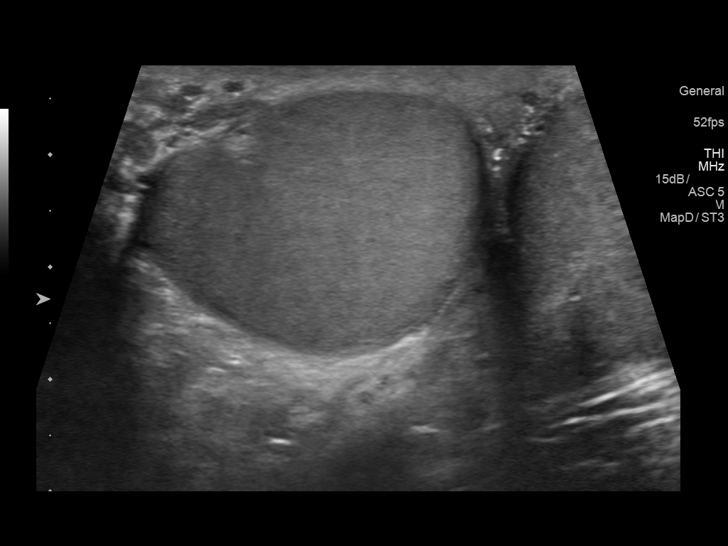
[im 11/62]
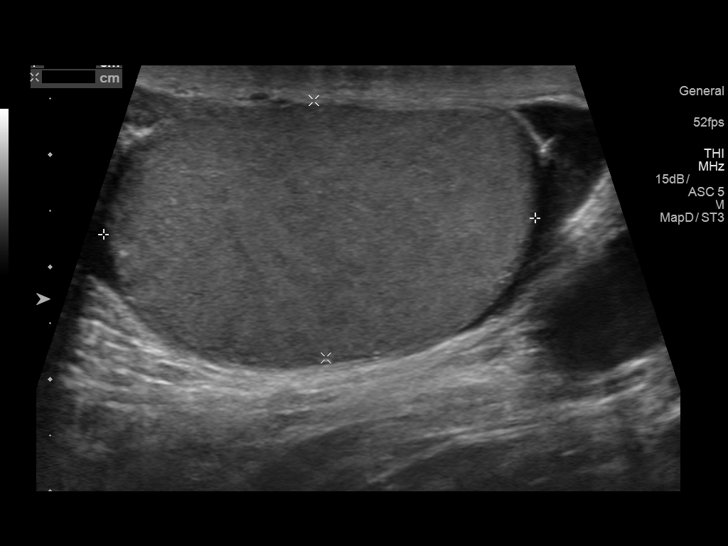
[im 16/62]
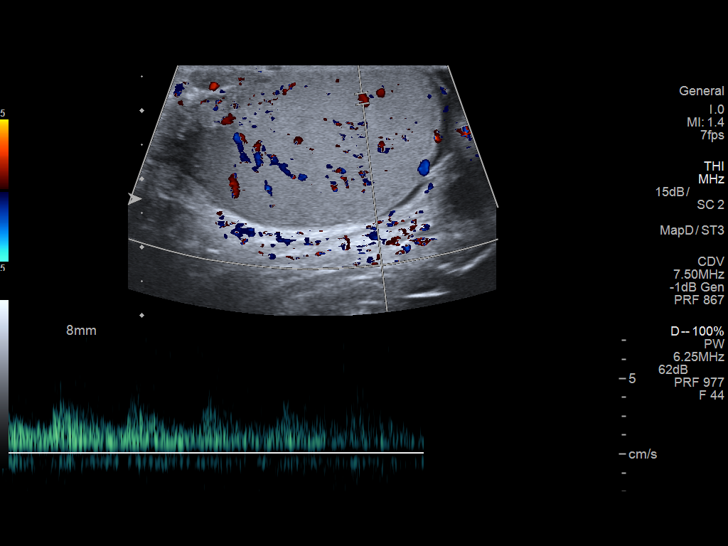
[im 21/62]
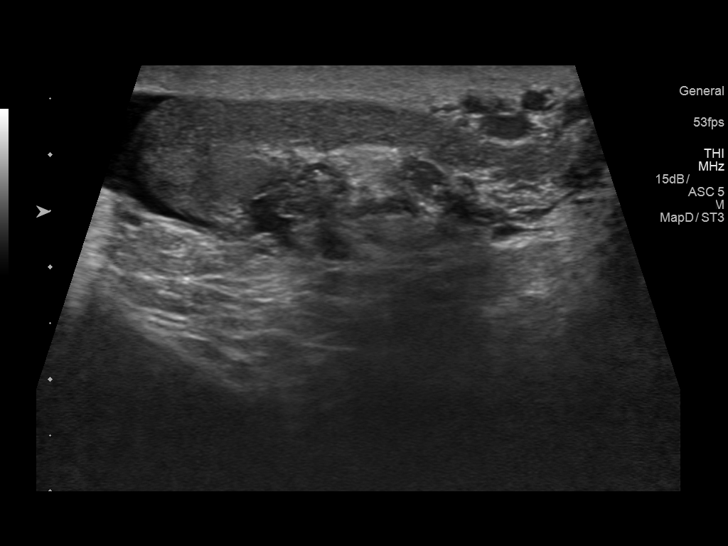
[im 26/62]
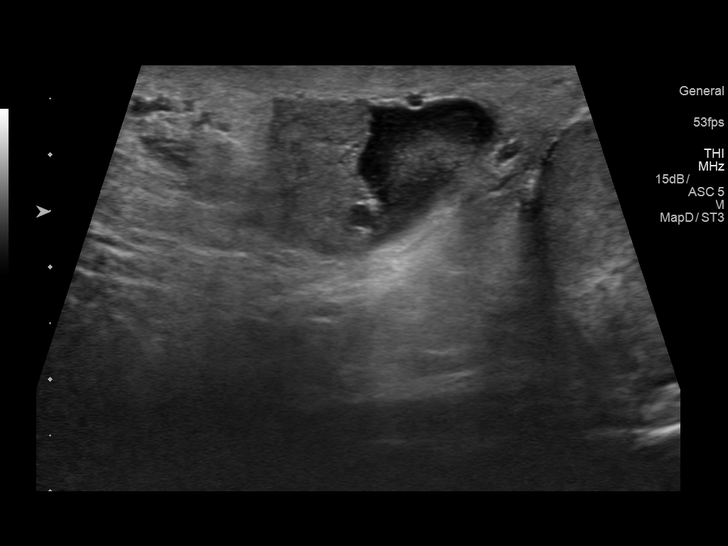
[im 31/62]
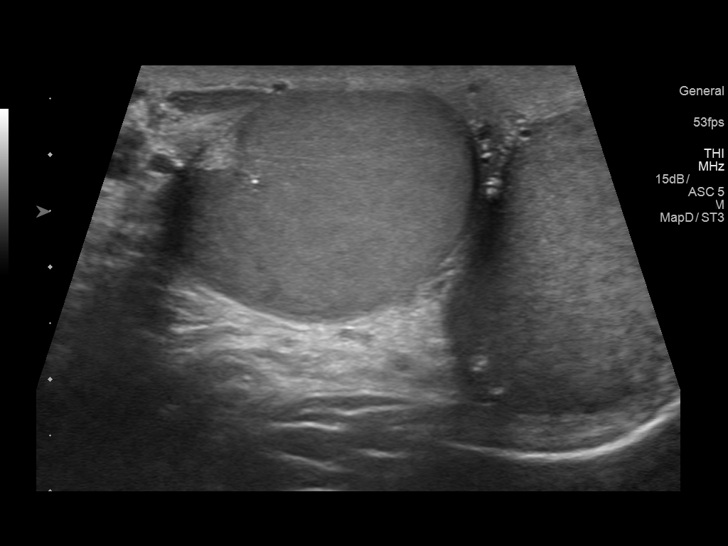
[im 36/62]
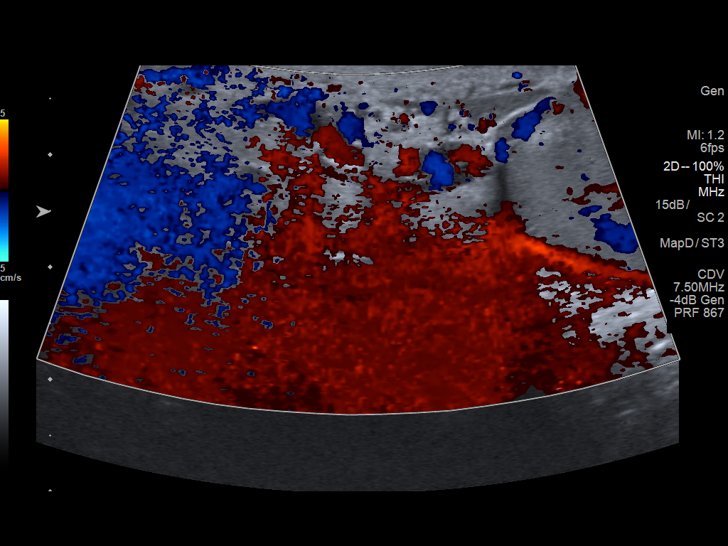
[im 41/62]
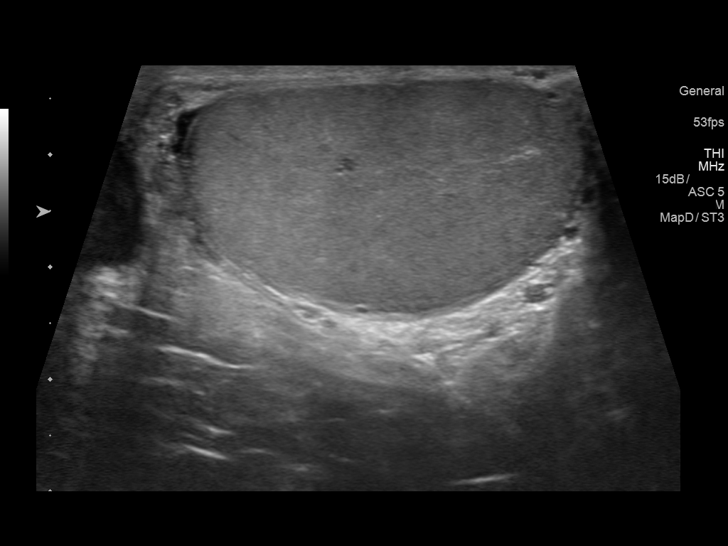
[im 46/62]
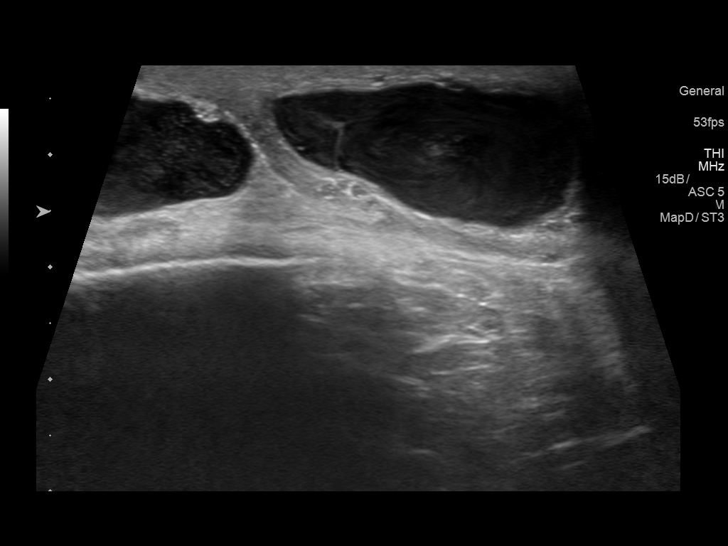
[im 51/62]
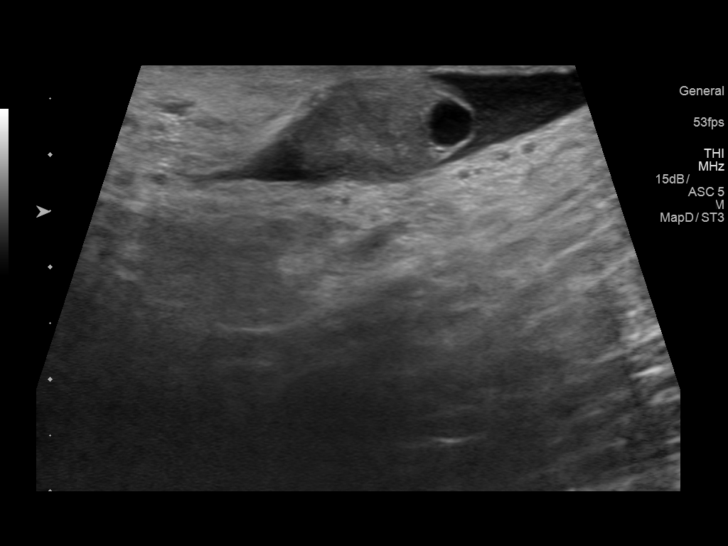
[im 56/62]
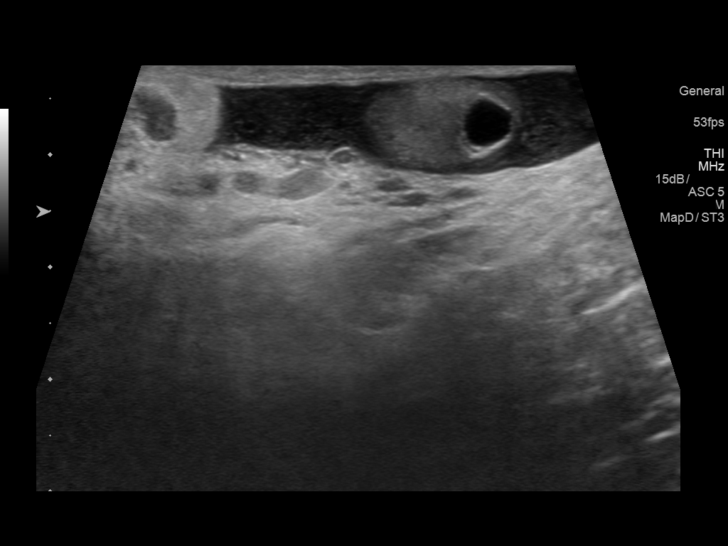
[im 62/62]
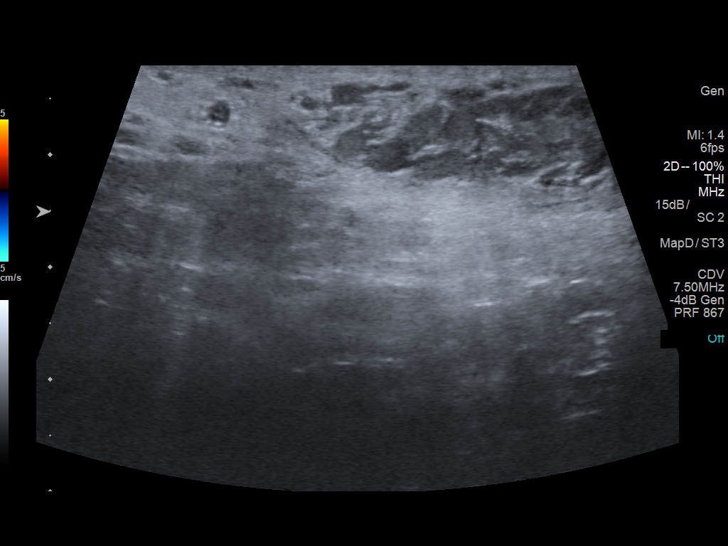

[13 of 25 positions shown; findings below may reference images not displayed]

FINDINGS: Right testicle

Measurements: 3.8 x 2.2 x 2.9 cm. No mass lesion. Few punctate micro
calcifications noted clustered at the upper pole.

Left testicle

Measurements: 3.6 x 2.1 x 3.1 cm. No mass or microlithiasis
visualized.

Right epididymis: Normal in size and appearance. Small 5 mm cyst
noted at the right epididymal head.

Left epididymis: Normal in size and appearance. Small 4 mm cyst
noted at the left epididymal head.

Hydrocele: Small bilateral hydroceles, mildly complex in appearance
with internal echoes.

Varicocele:  No appreciable varicocele or hernia identified.

Pulsed Doppler interrogation of both testes demonstrates normal low
resistance arterial and venous waveforms bilaterally.
IMPRESSION: 1. Small mildly complex bilateral hydroceles.
2. No other acute abnormality identified.  No varicocele or hernia.
3. Few punctate microcalcifications clustered within the right
testicle. Current literature suggests that testicular microlithiasis
is not a significant independent risk factor for development of
testicular carcinoma, and that follow up imaging is not warranted in
the absence of other risk factors. Monthly testicular
self-examination and annual physical exams are considered
appropriate surveillance. If patient has other risk factors for
testicular carcinoma, then referral to Urology should be considered.
(Reference: Inge Lis, et al.: A 5-Year Follow up Study of
Asymptomatic Men with Testicular Microlithiasis. J Urol 7448;

## 2017-08-15 ENCOUNTER — Ambulatory Visit (INDEPENDENT_AMBULATORY_CARE_PROVIDER_SITE_OTHER): Payer: BLUE CROSS/BLUE SHIELD | Admitting: Sports Medicine

## 2017-08-15 VITALS — BP 124/76 | Ht 76.0 in | Wt 228.0 lb

## 2017-08-15 DIAGNOSIS — M79662 Pain in left lower leg: Secondary | ICD-10-CM | POA: Diagnosis not present

## 2017-08-15 NOTE — Progress Notes (Signed)
   Subjective:    Patient ID: Tyler Howe, male    DOB: Nov 04, 1986, 31 y.o.   MRN: [id number]  HPI chief complaint: Left calf pain and tightness  Patient comes in today complaining of left calf pain and tightness. He was last seen in the office back in February. He was complaining of this back then. He was also complaining of some Achilles tendon pain. Ultrasound of the Achilles tendon showed Achilles tendinopathy and he was started on a topical nitroglycerin protocol and given a home eccentric strengthening program. As a result, his Achilles pain has resolved but he still has chronic calf pain and tightness. The calf pain precedes his Achilles pain. It's been present for several years. It's worse with activity such as running but he does note that he has discomfort even with simple ambulation. No pain at rest. He does get some numbness and tingling into his foot. No low back pain. No weakness. His calf pain initially started after an acute injury while running. He denies any swelling in his calf. No pain in his knee.  Interim medical history reviewed Medications reviewed Allergies reviewed    Review of Systems As above    Objective:   Physical Exam Well-developed, well-nourished. No acute distress. Vital signs reviewed  Left calf: There is tenderness to palpation in the mid calf right at the junction of the gastroc and soleus. There is no palpable defect. No swelling. No atrophy. Patient has significantly limited active and passive dorsiflexion of the left ankle compared to the right. No tenderness to palpation along the Achilles.  Neurological exam: Reflexes are equal at the Achilles and patellar tendons. Strength is 5/5 in both lower extremities. Sensation is intact to light touch grossly.  MSK ultrasound of the left calf was performed. Limited images directly over his area of pain were obtained. The gastrocnemius in this area is quite disorganized consistent with an old tear with  probable scar tissue formation. I do not see a hematoma or any new tear.       Assessment & Plan:   Chronic left calf pain and tightness with ultrasound evidence of an old calf tear  Patient has extremely limited dorsiflexion on exam. He will start a home exercise program consisting of gastrocnemius and soleus stretches. He has also had results with dry needling in the past. If he would like to return to his physical therapist for this,I think that is reasonable. He may also try a calf sleeve when active. I think he should stick with biking and elliptical for now but hopefully we will be able to get him back to running soon. Patient will follow-up with me in 6-8 weeks for reevaluation and repeat ultrasound. Call with questions or concerns in the interim.

## 2017-08-23 ENCOUNTER — Other Ambulatory Visit: Payer: Self-pay | Admitting: Sports Medicine

## 2017-09-02 DIAGNOSIS — R102 Pelvic and perineal pain: Secondary | ICD-10-CM | POA: Diagnosis not present

## 2017-09-06 DIAGNOSIS — Z23 Encounter for immunization: Secondary | ICD-10-CM | POA: Diagnosis not present

## 2017-12-19 ENCOUNTER — Ambulatory Visit: Payer: BLUE CROSS/BLUE SHIELD | Admitting: Physician Assistant

## 2017-12-19 ENCOUNTER — Encounter: Payer: Self-pay | Admitting: Physician Assistant

## 2017-12-19 ENCOUNTER — Other Ambulatory Visit: Payer: Self-pay

## 2017-12-19 VITALS — BP 112/62 | HR 94 | Temp 97.8°F | Resp 16 | Ht 76.0 in | Wt 236.4 lb

## 2017-12-19 DIAGNOSIS — J019 Acute sinusitis, unspecified: Secondary | ICD-10-CM | POA: Diagnosis not present

## 2017-12-19 DIAGNOSIS — G8929 Other chronic pain: Secondary | ICD-10-CM | POA: Diagnosis not present

## 2017-12-19 DIAGNOSIS — B9689 Other specified bacterial agents as the cause of diseases classified elsewhere: Secondary | ICD-10-CM

## 2017-12-19 DIAGNOSIS — M546 Pain in thoracic spine: Secondary | ICD-10-CM

## 2017-12-19 MED ORDER — DOXYCYCLINE HYCLATE 100 MG PO CAPS: 100.0000 mg | ORAL_CAPSULE | Freq: Two times a day (BID) | ORAL | 0 refills | Status: AC

## 2017-12-19 NOTE — Patient Instructions (Addendum)
Go ahead and start the antibiotic.  Hopefully he will be feeling better in about 48 hours.  Please complete the course.  I would suggest taking the medicine with food and wearing a light sunscreen daily while taking antibiotics if you plan to be out of the sun.  Expect a phone call from occupational therapy for the evaluation of thoracic back pain thought to be secondary to work.    IF you received an x-ray today, you will receive an invoice from [facility]. Please contact [facility] at [phone] with questions or concerns regarding your invoice.   IF you received labwork today, you will receive an invoice from [facility]. Please contact [facility] at [phone] with questions or concerns regarding your invoice.   Our billing staff will not be able to assist you with questions regarding bills from these companies.  You will be contacted with the lab results as soon as they are available. The fastest way to get your results is to activate your My Chart account. Instructions are located on the last page of this paperwork. If you have not heard from us regarding the results in 2 weeks, please contact this office.

## 2017-12-19 NOTE — Progress Notes (Signed)
12/19/2017 10:10 AM   DOB: [birth year] / MRN: [id number]  SUBJECTIVE:  Tyler Howe is a 32 y.o. male presenting for nasal congestion that started with a cold about 2 weeks ago.  He felt that he was getting better on day 4, and a few days later he suddenly became worse.  He complains mostly frontal sinus headache.  Denies fever teeth pain.  He has tried pseudoephedrine which seem to be helping initially and now he is getting no relief.  He is also been taking NSAIDs also with some relief.  He tells me for about 3-4 years now has been having thoracic back pain that seems to get worse when working at his desk.  He denies numbness in his hands as well as weakness.  Tells me at the end of the week that his upper back is very very painful.  No difficulty with urination or defecation.  Negative for trauma.  He has No Known Allergies.   He  has no past medical history on file.    He  reports that  has never smoked. he has never used smokeless tobacco. He reports that he does not drink alcohol or use drugs. He  has no sexual activity history on file. The patient  has no past surgical history on file.  His family history is not on file.  ROS   As per HPI  The problem list and medications were reviewed and updated by myself where necessary and exist elsewhere in the encounter.   OBJECTIVE:  BP 112/62 (BP Location: Right Arm, Patient Position: Sitting, Cuff Size: Large)   Pulse 94   Temp 97.8 F (36.6 C) (Oral)   Resp 16   Ht 6\' 4"  (1.93 m)   Wt 236 lb 6.4 oz (107.2 kg)   SpO2 96%   BMI 28.78 kg/m   Physical Exam  Constitutional: He is oriented to person, place, and time. He appears well-developed. He is active and cooperative.  Non-toxic appearance.  HENT:  Right Ear: Hearing, tympanic membrane, external ear and ear canal normal.  Left Ear: Hearing, tympanic membrane, external ear and ear canal normal.  Nose: Right sinus exhibits maxillary sinus tenderness and frontal sinus  tenderness. Left sinus exhibits maxillary sinus tenderness and frontal sinus tenderness.  Mouth/Throat: Uvula is midline, oropharynx is clear and moist and mucous membranes are normal. No oropharyngeal exudate, posterior oropharyngeal edema or tonsillar abscesses.  Eyes: Conjunctivae are normal. Pupils are equal, round, and reactive to light.  Cardiovascular: Normal rate, regular rhythm, S1 normal, S2 normal, normal heart sounds, intact distal pulses and normal pulses. Exam reveals no gallop and no friction rub.  No murmur heard. Pulmonary/Chest: Effort normal. No stridor. No tachypnea. No respiratory distress. He has no wheezes. He has no rales.  Abdominal: He exhibits no distension.  Musculoskeletal: He exhibits no edema.  Lymphadenopathy:       Head (right side): No submandibular and no tonsillar adenopathy present.       Head (left side): No submandibular and no tonsillar adenopathy present.    He has no cervical adenopathy.  Neurological: He is alert and oriented to person, place, and time. He displays normal reflexes. No cranial nerve deficit or sensory deficit. He exhibits normal muscle tone. Coordination and gait normal.  Skin: Skin is warm and dry. He is not diaphoretic. No pallor.  Vitals reviewed.   No results found for this or any previous visit (from the past 72 hour(s)).  No results found.  ASSESSMENT AND PLAN:  Tyler Howe was seen today for sinus problem.  Diagnoses and all orders for this visit:  Acute bacterial rhinosinusitis -     doxycycline (VIBRAMYCIN) 100 MG capsule; Take 1 capsule (100 mg total) by mouth 2 (two) times daily for 10 days.  Chronic midline thoracic back pain -     Ambulatory referral to Occupational Therapy    The patient is advised to call or return to clinic if he does not see an improvement in symptoms, or to seek the care of the closest emergency department if he worsens with the above plan.   Deliah Boston, MHS, PA-C Primary Care at  [facility] 12/19/2017 10:10 AM

## 2018-01-04 NOTE — Addendum Note (Signed)
Addended by: Ofilia NeasLARK, Joseh L on: 01/04/2018 02:32 PM   Modules accepted: Orders

## 2018-01-23 ENCOUNTER — Encounter: Payer: Self-pay | Admitting: Physician Assistant

## 2018-01-23 ENCOUNTER — Ambulatory Visit: Payer: Self-pay | Admitting: *Deleted

## 2018-01-23 NOTE — Telephone Encounter (Signed)
Pt asking if Tamiflu could be called in as a preventative for flu. Pt's wife was diagnosed with Influenza A on today and was told by her provider that he should start prophylaxis due to his wife coming back positive for the flu and  having a 33 year old. Pt states he is not currently having any symptoms. Pt states it would be hard for him to come in for an appt due to having a 33 year old. Pt states if medication could be called in he would like it to go to [facility] on the [address] of [address].   Answer Assessment - Initial Assessment Questions 1. TYPE of EXPOSURE: "How were you exposed?" (e.g., close contact, not a close contact)     Pt wife was positive for Influenza A 2. DATE of EXPOSURE: "When did the exposure occur?" (e.g., hour, days, weeks)     Confined today 3. PREGNANCY: "Is there any chance you are pregnant?" "When was your last menstrual period?"     n/a 4. HIGH RISK for COMPLICATIONS: "Do you have any heart or lung problems? Do you have a weakened immune system?" (e.g., CHF, COPD, asthma, HIV positive, chemotherapy, renal failure, diabetes mellitus, sickle cell anemia)     No assessed 5. SYMPTOMS: "Do you have any symptoms?" (e.g., cough, fever, sore throat, difficulty breathing).     no  Protocols used: INFLUENZA EXPOSURE-A-AH

## 2018-01-24 ENCOUNTER — Other Ambulatory Visit: Payer: Self-pay | Admitting: Physician Assistant

## 2018-01-24 MED ORDER — OSELTAMIVIR PHOSPHATE 75 MG PO CAPS: 75.0000 mg | ORAL_CAPSULE | Freq: Every day | ORAL | 0 refills | Status: DC

## 2018-01-24 NOTE — Telephone Encounter (Signed)
Provider Romilda GarretMichel Clark has sent in Tamiflu to pharmacy.

## 2018-01-30 ENCOUNTER — Telehealth: Payer: Self-pay | Admitting: Physician Assistant

## 2018-01-30 NOTE — Telephone Encounter (Signed)
Received fax from [facility] stating they have tried contacting pt several times and left messages with no return calls. They have closed the referral. Thanks.

## 2018-06-13 ENCOUNTER — Other Ambulatory Visit: Payer: Self-pay | Admitting: *Deleted

## 2018-06-13 DIAGNOSIS — M25562 Pain in left knee: Secondary | ICD-10-CM

## 2018-06-13 NOTE — Progress Notes (Signed)
d 

## 2018-09-06 DIAGNOSIS — Z23 Encounter for immunization: Secondary | ICD-10-CM | POA: Diagnosis not present

## 2018-12-19 DIAGNOSIS — F4320 Adjustment disorder, unspecified: Secondary | ICD-10-CM | POA: Diagnosis not present

## 2019-01-02 DIAGNOSIS — F4320 Adjustment disorder, unspecified: Secondary | ICD-10-CM | POA: Diagnosis not present

## 2019-01-18 DIAGNOSIS — F4320 Adjustment disorder, unspecified: Secondary | ICD-10-CM | POA: Diagnosis not present

## 2019-01-24 ENCOUNTER — Ambulatory Visit: Payer: Self-pay | Admitting: *Deleted

## 2019-01-24 NOTE — Telephone Encounter (Signed)
Contacted pt regarding his concerns; he states that his wife was diagnosed with h pylori 2 weeks ago, and she was started on antibiotics; he would like to know if he should be tested; the pt says that he has acid reflux, and he is having consistently softer and more frequent; the pt also says that he had episodes of diarrhea; he says that he has a history of anal tears and occassionally sees blood on the tissue when he wipes; recommendations made per nurse triage protocol; he would like last seen by Deliah Boston, and would like to transfer care to another provider; pt offered and accepted appointment with Dr Koren Shiver, [address], 01/25/2019 at 0940; he verbalized understanding.     Reason for Disposition . [1] MILD diarrhea (e.g., 1-3 or more stools than normal in past 24 hours) without known cause AND [2] present >  7 days  Answer Assessment - Initial Assessment Questions 1. DIARRHEA SEVERITY: "How bad is the diarrhea?" "How many extra stools have you had in the past 24 hours than normal?"    - NO DIARRHEA (SCALE 0)   - MILD (SCALE 1-3): Few loose or mushy BMs; increase of 1-3 stools over normal daily number of stools; mild increase in ostomy output.   -  MODERATE (SCALE 4-7): Increase of 4-6 stools daily over normal; moderate increase in ostomy output. * SEVERE (SCALE 8-10; OR 'WORST POSSIBLE'): Increase of 7 or more stools daily over normal; moderate increase in ostomy output; incontinence.     Mild to moderate 2. ONSET: "When did the diarrhea begin?"      01/20/2928 3. BM CONSISTENCY: "How loose or watery is the diarrhea?"      loose 4. VOMITING: "Are you also vomiting?" If so, ask: "How many times in the past 24 hours?"      no 5. ABDOMINAL PAIN: "Are you having any abdominal pain?" If yes: "What does it feel like?" (e.g., crampy, dull, intermittent, constant)      no 6. ABDOMINAL PAIN SEVERITY: If present, ask: "How bad is the pain?"  (e.g., Scale 1-10; mild, moderate, or severe)   -  MILD (1-3): doesn't interfere with normal activities, abdomen soft and not tender to touch    - MODERATE (4-7): interferes with normal activities or awakens from sleep, tender to touch    - SEVERE (8-10): excruciating pain, doubled over, unable to do any normal activities       n/a 7. ORAL INTAKE: If vomiting, "Have you been able to drink liquids?" "How much fluids have you had in the past 24 hours?"     yes drinking normally 8. HYDRATION: "Any signs of dehydration?" (e.g., dry mouth [not just dry lips], too weak to stand, dizziness, new weight loss) "When did you last urinate?"     no 9. EXPOSURE: "Have you traveled to a foreign country recently?" "Have you been exposed to anyone with diarrhea?" "Could you have eaten any food that was spoiled?"     no 10. ANTIBIOTIC USE: "Are you taking antibiotics now or have you taken antibiotics in the past 2 months?"       no 11. OTHER SYMPTOMS: "Do you have any other symptoms?" (e.g., fever, blood in stool)       no 12. PREGNANCY: "Is there any chance you are pregnant?" "When was your last menstrual period?"       n/a  Protocols used: DIARRHEA-A-AH

## 2019-01-25 ENCOUNTER — Encounter: Payer: Self-pay | Admitting: Family Medicine

## 2019-01-25 ENCOUNTER — Other Ambulatory Visit: Payer: Self-pay

## 2019-01-25 ENCOUNTER — Ambulatory Visit: Payer: BLUE CROSS/BLUE SHIELD | Admitting: Family Medicine

## 2019-01-25 VITALS — BP 118/76 | HR 65 | Temp 98.2°F | Ht 76.0 in | Wt 239.8 lb

## 2019-01-25 DIAGNOSIS — E782 Mixed hyperlipidemia: Secondary | ICD-10-CM | POA: Diagnosis not present

## 2019-01-25 DIAGNOSIS — K219 Gastro-esophageal reflux disease without esophagitis: Secondary | ICD-10-CM | POA: Diagnosis not present

## 2019-01-25 DIAGNOSIS — R197 Diarrhea, unspecified: Secondary | ICD-10-CM | POA: Diagnosis not present

## 2019-01-25 DIAGNOSIS — R945 Abnormal results of liver function studies: Secondary | ICD-10-CM

## 2019-01-25 DIAGNOSIS — Z13220 Encounter for screening for lipoid disorders: Secondary | ICD-10-CM

## 2019-01-25 DIAGNOSIS — R7989 Other specified abnormal findings of blood chemistry: Secondary | ICD-10-CM

## 2019-01-25 NOTE — Progress Notes (Signed)
2/20/20209:51 AM  Tyler Howe 03-14-1986, 33 y.o. male [id number]  Chief Complaint  Patient presents with  . Labs Only    exposed to h pylori from wife would like panel done. Not having any symptoms. takes prilosec from time to time.    HPI:   Patient is a 33 y.o. male who presents today requesting for h pylori test  His wife has been having stomach issues for over a month She was recently seen by GI and started on abx for h pylori  He reports recent on and off acid reflux, worse of recent Last omeprazole was 3-4 days ago In the past few weeks having more stools, sometimes diarrhea Denies any black tarry stools, reports occ has bright red blood, has h/o fissure Reports egd about 2 years which was normal No nausea, abd pain, no bloating, no fever or chills, no early satiety, no weight changes Drinks lots of caffeine, does not smoke, does not drink, no nsaids Has gained about 40 lbs of recent  Fall Risk  01/25/2019 12/19/2017 04/22/2017 01/27/2017 05/31/2016  Falls in the past year? 0 No No No No  Number falls in past yr: 0 - - - -  Injury with Fall? 0 - - - -     Depression screen [facility] 2/9 01/25/2019 12/19/2017 04/22/2017  Decreased Interest 0 0 0  Down, Depressed, Hopeless 0 0 0  PHQ - 2 Score 0 0 0    No Known Allergies  Prior to Admission medications   Not on File    History reviewed. No pertinent past medical history.  History reviewed. No pertinent surgical history.  Social History   Tobacco Use  . Smoking status: Never Smoker  . Smokeless tobacco: Never Used  Substance Use Topics  . Alcohol use: No    Alcohol/week: 0.0 standard drinks    Family History  Problem Relation Age of Onset  . Pancreatic cancer Father     ROS Per hpi  OBJECTIVE:  Blood pressure 118/76, pulse 65, temperature 98.2 F (36.8 C), temperature source Oral, height 6\' 4"  (1.93 m), weight 239 lb 12.8 oz (108.8 kg), SpO2 98 %. Body mass index is 29.19 kg/m.   Physical  Exam Vitals signs and nursing note reviewed.  Constitutional:      Appearance: He is well-developed.  HENT:     Head: Normocephalic and atraumatic.  Eyes:     Conjunctiva/sclera: Conjunctivae normal.     Pupils: Pupils are equal, round, and reactive to light.  Neck:     Musculoskeletal: Neck supple.  Cardiovascular:     Rate and Rhythm: Normal rate and regular rhythm.     Heart sounds: No murmur. No friction rub. No gallop.   Pulmonary:     Effort: Pulmonary effort is normal.     Breath sounds: Normal breath sounds. No wheezing or rales.  Abdominal:     General: Bowel sounds are normal.     Palpations: Abdomen is soft. There is no hepatomegaly or splenomegaly.     Tenderness: There is abdominal tenderness in the left lower quadrant. There is no guarding or rebound.     Hernia: No hernia is present.  Skin:    General: Skin is warm and dry.  Neurological:     Mental Status: He is alert and oriented to person, place, and time.      ASSESSMENT and PLAN  1. Gastroesophageal reflux disease without esophagitis Discussed diet and LFM. Discussed needs to be off reflux meds  for 2 weeks prior to test. Will return for test, lab visit ok. - H. pylori breath test; Future  2. Diarrhea, unspecified type Discussed diet, pending h pylori test, LLQ TTP noted but no actual pain nor signs of infection. Consider further workup if persist. RTC precautions reviewed. - CBC with Differential/Platelet - Comprehensive metabolic panel  3. Screening for lipid disorders - Lipid panel  Return if symptoms worsen or fail to improve.    [name], MD Primary Care at [facility] [address] [place], [place] [address] Ph.  [phone] Fax [phone]

## 2019-01-25 NOTE — Patient Instructions (Signed)

## 2019-01-26 LAB — COMPREHENSIVE METABOLIC PANEL
ALT: 56 IU/L — ABNORMAL HIGH (ref 0–44)
AST: 29 IU/L (ref 0–40)
Albumin/Globulin Ratio: 2.4 — ABNORMAL HIGH (ref 1.2–2.2)
Albumin: 4.7 g/dL (ref 4.0–5.0)
Alkaline Phosphatase: 89 IU/L (ref 39–117)
BUN/Creatinine Ratio: 13 (ref 9–20)
BUN: 11 mg/dL (ref 6–20)
Bilirubin Total: 0.4 mg/dL (ref 0.0–1.2)
CO2: 21 mmol/L (ref 20–29)
Calcium: 9.7 mg/dL (ref 8.7–10.2)
Chloride: 104 mmol/L (ref 96–106)
Creatinine, Ser: 0.88 mg/dL (ref 0.76–1.27)
GFR calc Af Amer: 130 mL/min/{1.73_m2} (ref >59)
GFR calc non Af Amer: 113 mL/min/{1.73_m2} (ref >59)
Globulin, Total: 2 g/dL (ref 1.5–4.5)
Glucose: 77 mg/dL (ref 65–99)
Potassium: 4.8 mmol/L (ref 3.5–5.2)
Sodium: 141 mmol/L (ref 134–144)
Total Protein: 6.7 g/dL (ref 6.0–8.5)

## 2019-01-26 LAB — LIPID PANEL
Chol/HDL Ratio: 7 ratio — ABNORMAL HIGH (ref 0.0–5.0)
Cholesterol, Total: 146 mg/dL (ref 100–199)
HDL: 21 mg/dL — ABNORMAL LOW (ref >39)
Triglycerides: 405 mg/dL — ABNORMAL HIGH (ref 0–149)

## 2019-01-26 LAB — CBC WITH DIFFERENTIAL/PLATELET
Basophils Absolute: 0.1 10*3/uL (ref 0.0–0.2)
Basos: 1 %
EOS (ABSOLUTE): 0.1 10*3/uL (ref 0.0–0.4)
Eos: 2 %
Hematocrit: 45.7 % (ref 37.5–51.0)
Hemoglobin: 15.9 g/dL (ref 13.0–17.7)
Immature Grans (Abs): 0 10*3/uL (ref 0.0–0.1)
Immature Granulocytes: 0 %
Lymphocytes Absolute: 2.9 10*3/uL (ref 0.7–3.1)
Lymphs: 32 %
MCH: 29.4 pg (ref 26.6–33.0)
MCHC: 34.8 g/dL (ref 31.5–35.7)
MCV: 85 fL (ref 79–97)
Monocytes Absolute: 0.6 10*3/uL (ref 0.1–0.9)
Monocytes: 7 %
Neutrophils Absolute: 5.2 10*3/uL (ref 1.4–7.0)
Neutrophils: 58 %
Platelets: 250 10*3/uL (ref 150–450)
RBC: 5.4 x10E6/uL (ref 4.14–5.80)
RDW: 13 % (ref 11.6–15.4)
WBC: 9 10*3/uL (ref 3.4–10.8)

## 2019-02-01 ENCOUNTER — Encounter: Payer: Self-pay | Admitting: Family Medicine

## 2019-02-02 DIAGNOSIS — F4320 Adjustment disorder, unspecified: Secondary | ICD-10-CM | POA: Diagnosis not present

## 2019-02-04 NOTE — Addendum Note (Signed)
Addended by: [name] on: 02/04/2019 07:54 PM   Modules accepted: Orders

## 2019-05-25 DIAGNOSIS — F4320 Adjustment disorder, unspecified: Secondary | ICD-10-CM | POA: Diagnosis not present

## 2019-06-04 DIAGNOSIS — F4320 Adjustment disorder, unspecified: Secondary | ICD-10-CM | POA: Diagnosis not present

## 2019-06-25 DIAGNOSIS — F4320 Adjustment disorder, unspecified: Secondary | ICD-10-CM | POA: Diagnosis not present

## 2019-07-03 DIAGNOSIS — F4320 Adjustment disorder, unspecified: Secondary | ICD-10-CM | POA: Diagnosis not present

## 2019-09-18 DIAGNOSIS — F4320 Adjustment disorder, unspecified: Secondary | ICD-10-CM | POA: Diagnosis not present

## 2019-09-28 DIAGNOSIS — H1130 Conjunctival hemorrhage, unspecified eye: Secondary | ICD-10-CM | POA: Diagnosis not present

## 2019-10-02 DIAGNOSIS — F4320 Adjustment disorder, unspecified: Secondary | ICD-10-CM | POA: Diagnosis not present

## 2019-10-09 DIAGNOSIS — F4320 Adjustment disorder, unspecified: Secondary | ICD-10-CM | POA: Diagnosis not present

## 2019-10-16 DIAGNOSIS — F4320 Adjustment disorder, unspecified: Secondary | ICD-10-CM | POA: Diagnosis not present

## 2019-10-22 ENCOUNTER — Telehealth: Payer: Self-pay | Admitting: General Practice

## 2019-10-22 ENCOUNTER — Encounter: Payer: Self-pay | Admitting: Family Medicine

## 2019-10-22 NOTE — Telephone Encounter (Signed)
Pt has had hpv in college.  He thinks he is having an outbreak. He has not had an issue in 14 years.  He is asking if something can be called in for him without having to come into office.  If so, preferred pharmacy is [facility] .  Call back is [phone] Pt is able to come in if needed. He wanted to ask first to avoid getting sick.  Thanks

## 2019-10-23 ENCOUNTER — Other Ambulatory Visit: Payer: Self-pay

## 2019-10-23 ENCOUNTER — Telehealth: Payer: BLUE CROSS/BLUE SHIELD | Admitting: Family Medicine

## 2019-10-23 ENCOUNTER — Ambulatory Visit: Payer: BC Managed Care – PPO | Admitting: Family Medicine

## 2019-10-23 ENCOUNTER — Encounter: Payer: Self-pay | Admitting: Family Medicine

## 2019-10-23 VITALS — BP 121/81 | HR 72 | Temp 98.3°F | Ht 76.0 in | Wt 242.3 lb

## 2019-10-23 DIAGNOSIS — A630 Anogenital (venereal) warts: Secondary | ICD-10-CM | POA: Diagnosis not present

## 2019-10-23 DIAGNOSIS — F4320 Adjustment disorder, unspecified: Secondary | ICD-10-CM | POA: Diagnosis not present

## 2019-10-23 MED ORDER — IMIQUIMOD 3.75 % EX CREA: TOPICAL_CREAM | CUTANEOUS | 2 refills | Status: DC

## 2019-10-23 NOTE — Progress Notes (Signed)
   11/17/202010:05 AM  Tyler Howe 12/08/1985, 33 y.o., male [id number]  Chief Complaint  Patient presents with  . Genital Warts    says he contracted hpv in college, vaccinated for hpv prior to infection. Has not had break up in 10+ yrs that he knows of. Noticed 2 small bumps in genital area after shaving pubic hairs.     HPI:   Patient is a 33 y.o. male who presents today for genital bumps  H/o HPV about 10 years ago Noticed 2 new bumps after shaving He has no concerns for new STD testing  Depression screen [facility] 2/9 10/23/2019 01/25/2019 12/19/2017  Decreased Interest 0 0 0  Down, Depressed, Hopeless 0 0 0  PHQ - 2 Score 0 0 0    Fall Risk  10/23/2019 01/25/2019 12/19/2017 04/22/2017 01/27/2017  Falls in the past year? 0 0 No No No  Number falls in past yr: 0 0 - - -  Injury with Fall? 0 0 - - -     No Known Allergies  Prior to Admission medications   Not on File    History reviewed. No pertinent past medical history.  History reviewed. No pertinent surgical history.  Social History   Tobacco Use  . Smoking status: Never Smoker  . Smokeless tobacco: Never Used  Substance Use Topics  . Alcohol use: No    Alcohol/week: 0.0 standard drinks    Family History  Problem Relation Age of Onset  . Diabetes Mellitus II Mother   . Diverticulitis Mother   . Gallstones Father   . Pancreatitis Father     ROS Per hpi  OBJECTIVE:  Today's Vitals   10/23/19 0955  BP: 121/81  Pulse: 72  Temp: 98.3 F (36.8 C)  SpO2: 96%  Weight: 242 lb 4.8 oz (109.9 kg)  Height: 6\' 4"  (1.93 m)   Body mass index is 29.49 kg/m.   Physical Exam   Gen: AAOx3, NAD GU: 1 mm verrugated papule left side of mons pubis, left groin line with 2 very small papules Chaperone present  No results found for this or any previous visit (from the past 24 hour(s)).  No results found.   ASSESSMENT and PLAN  1. Genital warts Discussed treatment options, rx for imiquimod given.  Questions answered.  Other orders - Imiquimod 3.75 % CREA; Apply a thin layer over lesions every night for 8 hours until lesions resolved. Wash off with soap and water. Max duration of treatment is 8 weeks  Return if symptoms worsen or fail to improve.    Rutherford Guys, MD Primary Care at [facility] [place], [place] [address] Ph.  [phone] Fax [phone]

## 2019-10-23 NOTE — Patient Instructions (Signed)
° ° ° °  If you have lab work done today you will be contacted with your lab results within the next 2 weeks.  If you have not heard from us then please contact us. The fastest way to get your results is to register for My Chart. ° ° °IF you received an x-ray today, you will receive an invoice from [facility]. Please contact [facility] at [phone] with questions or concerns regarding your invoice.  ° °IF you received labwork today, you will receive an invoice from [facility]. Please contact [facility] at [phone] with questions or concerns regarding your invoice.  ° °Our billing staff will not be able to assist you with questions regarding bills from these companies. ° °You will be contacted with the lab results as soon as they are available. The fastest way to get your results is to activate your My Chart account. Instructions are located on the last page of this paperwork. If you have not heard from us regarding the results in 2 weeks, please contact this office. °  ° ° ° °

## 2019-10-25 NOTE — Telephone Encounter (Signed)
Pt has been evaluated in the office and Rx has been sent.

## 2019-11-08 DIAGNOSIS — F4320 Adjustment disorder, unspecified: Secondary | ICD-10-CM | POA: Diagnosis not present

## 2019-11-15 ENCOUNTER — Encounter: Payer: Self-pay | Admitting: Pediatrics

## 2019-11-15 ENCOUNTER — Other Ambulatory Visit: Payer: Self-pay

## 2019-11-15 ENCOUNTER — Ambulatory Visit: Payer: Self-pay

## 2019-11-15 ENCOUNTER — Ambulatory Visit: Payer: BC Managed Care – PPO | Admitting: Pediatrics

## 2019-11-15 VITALS — BP 120/80 | Ht 76.0 in | Wt 239.0 lb

## 2019-11-15 DIAGNOSIS — S29011A Strain of muscle and tendon of front wall of thorax, initial encounter: Secondary | ICD-10-CM

## 2019-11-15 NOTE — Progress Notes (Addendum)
  Tyler Howe - 33 y.o. male MRN [id number]  Date of birth: 12-13-1985  SUBJECTIVE:   CC: right pec strain   33 yo male presenting with acute right pectoralis injury. He reports that he was doing warm up reps of bench press and felt popping in his right pectoralis muscle with immediate pain. He was able to finish the rep but had persistent pain at right chest when he was brushing his teeth. When he uses the muscle, he feels like its about to cramp. No bruising, swelling noted.   ROS: No unexpected weight loss, fever, chills, swelling, instability, muscle pain, numbness/tingling, redness, otherwise see HPI   PMHx - Updated and reviewed.  Contributory factors include: Negative PSHx - Updated and reviewed.  Contributory factors include:  Negative FHx - Updated and reviewed.  Contributory factors include:  Negative Social Hx - Updated and reviewed. Contributory factors include: Negative Medications - reviewed   DATA REVIEWED: none  PHYSICAL EXAM:  VS: BP:120/80  HR: bpm  TEMP: ( )  RESP:   HT:6\' 4"  (193 cm)   WT:239 lb (108.4 kg)  BMI:29.1 PHYSICAL EXAM: Gen: NAD, alert, cooperative with exam, well-appearing HEENT: clear conjunctiva,  CV:  no edema, capillary refill brisk, normal rate Resp: non-labored Skin: no rashes, normal turgor  Neuro: no gross deficits.  Psych:  alert and oriented  Chest exam: Inspection: right pectoralis with no swelling, bruising. R nipple on equal level as left, no atrophy or drooping of pec noted Palpation: pec tendon palpated at its insertion point with no defect. TTP along myofascial plane in right pectoralis major and in muscle body ROM: Full ROM of shoulder Strength: strength intact with resisted arm internal rotation with elbows extended. Normal strength with IR, ER, forward flexion NVI  Right pectoralis major, limited ultrasound Pec tendon visualized, intact at insertion point. Hypoechoic changes noted a musculotendinous junction and muscle  body. No increased neovascularization.  Impression: partial tear of pec major at myotendinous junction  ASSESSMENT & PLAN:  33 yo male presenting with partial tear of pectoralis muscle after injury earlier today. Instructed him to avoid chest exercises for the time being. Return in 2 weeks for repeat scan prior to restarting chest lifts. Given early course in injury, could possibly develop hematoma. Instructed him to return sooner if he had increased swelling and bruising so we can scan him sooner.   I was the preceptor for this visit and available for immediate consultation.  I personally reviewed the ultrasound findings.  Insertion of the pectoralis major tendon is well visualized at the proximal humerus.  There are some hypoechoic changes at the musculotendinous junction consistent with a probable small partial tear.  Patient has excellent strength and no palpable defect.  No obvious deformity.  Treatment as above.  Follow-up in 2 weeks for reevaluation and repeat ultrasound.  If patient develops bruising or swelling in the interim that he is instructed to return to the office sooner.

## 2019-12-14 DIAGNOSIS — F4320 Adjustment disorder, unspecified: Secondary | ICD-10-CM | POA: Diagnosis not present

## 2020-01-04 DIAGNOSIS — F4320 Adjustment disorder, unspecified: Secondary | ICD-10-CM | POA: Diagnosis not present

## 2020-01-18 DIAGNOSIS — F4320 Adjustment disorder, unspecified: Secondary | ICD-10-CM | POA: Diagnosis not present

## 2020-01-29 DIAGNOSIS — F4320 Adjustment disorder, unspecified: Secondary | ICD-10-CM | POA: Diagnosis not present

## 2020-02-12 DIAGNOSIS — F4320 Adjustment disorder, unspecified: Secondary | ICD-10-CM | POA: Diagnosis not present

## 2020-02-29 DIAGNOSIS — F4320 Adjustment disorder, unspecified: Secondary | ICD-10-CM | POA: Diagnosis not present

## 2020-03-20 DIAGNOSIS — F4320 Adjustment disorder, unspecified: Secondary | ICD-10-CM | POA: Diagnosis not present

## 2020-04-01 DIAGNOSIS — F4320 Adjustment disorder, unspecified: Secondary | ICD-10-CM | POA: Diagnosis not present

## 2020-04-03 DIAGNOSIS — L814 Other melanin hyperpigmentation: Secondary | ICD-10-CM | POA: Diagnosis not present

## 2020-04-03 DIAGNOSIS — D225 Melanocytic nevi of trunk: Secondary | ICD-10-CM | POA: Diagnosis not present

## 2020-04-03 DIAGNOSIS — L578 Other skin changes due to chronic exposure to nonionizing radiation: Secondary | ICD-10-CM | POA: Diagnosis not present

## 2020-04-03 DIAGNOSIS — D1801 Hemangioma of skin and subcutaneous tissue: Secondary | ICD-10-CM | POA: Diagnosis not present

## 2020-04-10 DIAGNOSIS — M25672 Stiffness of left ankle, not elsewhere classified: Secondary | ICD-10-CM | POA: Diagnosis not present

## 2020-04-10 DIAGNOSIS — R269 Unspecified abnormalities of gait and mobility: Secondary | ICD-10-CM | POA: Diagnosis not present

## 2020-04-10 DIAGNOSIS — M79605 Pain in left leg: Secondary | ICD-10-CM | POA: Diagnosis not present

## 2020-04-17 DIAGNOSIS — R269 Unspecified abnormalities of gait and mobility: Secondary | ICD-10-CM | POA: Diagnosis not present

## 2020-04-17 DIAGNOSIS — M25672 Stiffness of left ankle, not elsewhere classified: Secondary | ICD-10-CM | POA: Diagnosis not present

## 2020-04-17 DIAGNOSIS — M79605 Pain in left leg: Secondary | ICD-10-CM | POA: Diagnosis not present

## 2020-04-22 DIAGNOSIS — M79605 Pain in left leg: Secondary | ICD-10-CM | POA: Diagnosis not present

## 2020-04-22 DIAGNOSIS — M25672 Stiffness of left ankle, not elsewhere classified: Secondary | ICD-10-CM | POA: Diagnosis not present

## 2020-04-22 DIAGNOSIS — R269 Unspecified abnormalities of gait and mobility: Secondary | ICD-10-CM | POA: Diagnosis not present

## 2020-04-24 DIAGNOSIS — M79605 Pain in left leg: Secondary | ICD-10-CM | POA: Diagnosis not present

## 2020-04-24 DIAGNOSIS — R269 Unspecified abnormalities of gait and mobility: Secondary | ICD-10-CM | POA: Diagnosis not present

## 2020-04-24 DIAGNOSIS — M25672 Stiffness of left ankle, not elsewhere classified: Secondary | ICD-10-CM | POA: Diagnosis not present

## 2020-05-06 DIAGNOSIS — M79605 Pain in left leg: Secondary | ICD-10-CM | POA: Diagnosis not present

## 2020-05-06 DIAGNOSIS — M25672 Stiffness of left ankle, not elsewhere classified: Secondary | ICD-10-CM | POA: Diagnosis not present

## 2020-05-06 DIAGNOSIS — R269 Unspecified abnormalities of gait and mobility: Secondary | ICD-10-CM | POA: Diagnosis not present

## 2020-05-13 DIAGNOSIS — R269 Unspecified abnormalities of gait and mobility: Secondary | ICD-10-CM | POA: Diagnosis not present

## 2020-05-13 DIAGNOSIS — M25672 Stiffness of left ankle, not elsewhere classified: Secondary | ICD-10-CM | POA: Diagnosis not present

## 2020-05-13 DIAGNOSIS — M79605 Pain in left leg: Secondary | ICD-10-CM | POA: Diagnosis not present

## 2020-05-20 DIAGNOSIS — R269 Unspecified abnormalities of gait and mobility: Secondary | ICD-10-CM | POA: Diagnosis not present

## 2020-05-20 DIAGNOSIS — M25672 Stiffness of left ankle, not elsewhere classified: Secondary | ICD-10-CM | POA: Diagnosis not present

## 2020-05-20 DIAGNOSIS — M79605 Pain in left leg: Secondary | ICD-10-CM | POA: Diagnosis not present

## 2020-05-26 DIAGNOSIS — M25672 Stiffness of left ankle, not elsewhere classified: Secondary | ICD-10-CM | POA: Diagnosis not present

## 2020-05-26 DIAGNOSIS — M79605 Pain in left leg: Secondary | ICD-10-CM | POA: Diagnosis not present

## 2020-05-26 DIAGNOSIS — R269 Unspecified abnormalities of gait and mobility: Secondary | ICD-10-CM | POA: Diagnosis not present

## 2020-05-29 DIAGNOSIS — R269 Unspecified abnormalities of gait and mobility: Secondary | ICD-10-CM | POA: Diagnosis not present

## 2020-05-29 DIAGNOSIS — M25672 Stiffness of left ankle, not elsewhere classified: Secondary | ICD-10-CM | POA: Diagnosis not present

## 2020-05-29 DIAGNOSIS — M79605 Pain in left leg: Secondary | ICD-10-CM | POA: Diagnosis not present

## 2020-07-31 ENCOUNTER — Telehealth: Payer: Self-pay | Admitting: Family Medicine

## 2020-07-31 NOTE — Telephone Encounter (Signed)
Called pt LVM for him to call back / patient wanting to set up appt

## 2020-08-01 DIAGNOSIS — D225 Melanocytic nevi of trunk: Secondary | ICD-10-CM | POA: Diagnosis not present

## 2020-08-04 ENCOUNTER — Encounter: Payer: Self-pay | Admitting: Family Medicine

## 2020-08-04 ENCOUNTER — Other Ambulatory Visit: Payer: Self-pay

## 2020-08-04 ENCOUNTER — Ambulatory Visit: Payer: BC Managed Care – PPO | Admitting: Family Medicine

## 2020-08-04 VITALS — BP 119/80 | HR 70 | Temp 98.3°F | Ht 76.0 in | Wt 232.2 lb

## 2020-08-04 DIAGNOSIS — Z832 Family history of diseases of the blood and blood-forming organs and certain disorders involving the immune mechanism: Secondary | ICD-10-CM

## 2020-08-04 DIAGNOSIS — Z8249 Family history of ischemic heart disease and other diseases of the circulatory system: Secondary | ICD-10-CM

## 2020-08-04 NOTE — Progress Notes (Signed)
8/30/20219:35 AM  Tyler Howe [birth year], 34 y.o., male [id number]  Chief Complaint  Patient presents with  . Advice Only    concerned about family hx of clotting as it contributee to the covid vaccine    HPI:   Patient is a 35 y.o. male who presents today for concerns re family history of VTE  His mother, maternal nephew, and both maternal grandparents all have had blood clots He also has a maternal uncle that takes blood thinner but unsure why His mother has been on blood thinners since her early 16s No fhx of cancer Patient is already vaccinated with moderna about 3 months ago without any issues Patient is wondering about his own risk for blood clots and requesting d dimer test He denies any fatigue, CP, SOB, leg swelling, hemoptysis Bleeds easily when shaving but not sure if abnormally so  Depression screen [facility] 2/9 10/23/2019 01/25/2019 12/19/2017  Decreased Interest 0 0 0  Down, Depressed, Hopeless 0 0 0  PHQ - 2 Score 0 0 0    Fall Risk  10/23/2019 01/25/2019 12/19/2017 04/22/2017 01/27/2017  Falls in the past year? 0 0 No No No  Number falls in past yr: 0 0 - - -  Injury with Fall? 0 0 - - -     No Known Allergies  Prior to Admission medications   Medication Sig Start Date End Date Taking? Authorizing Provider  Imiquimod 3.75 % CREA Apply a thin layer over lesions every night for 8 hours until lesions resolved. Wash off with soap and water. Max duration of treatment is 8 weeks Patient not taking: Reported on 08/04/2020 10/23/19   Tyler Lipps, MD    History reviewed. No pertinent past medical history.  History reviewed. No pertinent surgical history.  Social History   Tobacco Use  . Smoking status: Never Smoker  . Smokeless tobacco: Never Used  Substance Use Topics  . Alcohol use: No    Alcohol/week: 0.0 standard drinks    Family History  Problem Relation Age of Onset  . Diabetes Mellitus II Mother   . Diverticulitis Mother   . Diabetes Mother    . Gallstones Father   . Pancreatitis Father     ROS   OBJECTIVE:  Today's Vitals   08/04/20 0919  BP: 119/80  Pulse: 70  Temp: 98.3 F (36.8 C)  SpO2: 95%  Weight: 232 lb 3.2 oz (105.3 kg)  Height: 6\' 4"  (1.93 m)   Body mass index is 28.26 kg/m.   Physical Exam Vitals and nursing note reviewed.  Constitutional:      Appearance: He is well-developed.  HENT:     Head: Normocephalic and atraumatic.  Eyes:     Conjunctiva/sclera: Conjunctivae normal.     Pupils: Pupils are equal, round, and reactive to light.  Pulmonary:     Effort: Pulmonary effort is normal.  Musculoskeletal:     Cervical back: Neck supple.  Skin:    General: Skin is warm and dry.  Neurological:     Mental Status: He is alert and oriented to person, place, and time.       No results found for this or any previous visit (from the past 24 hour(s)).  No results found.   ASSESSMENT and PLAN  1. Family history of thrombosis in first degree relative - Factor V Leiden - Antithrombin III - Prothrombin Gene Mutation - Protein C activity - Protein S activity - D-dimer, quantitative (not at [facility])  No follow-ups  on file.    Tyler Lipps, MD Primary Care at [facility] [address] [place], [place] [address] Ph.  [phone] Fax [phone]

## 2020-08-04 NOTE — Patient Instructions (Signed)
° ° ° °  If you have lab work done today you will be contacted with your lab results within the next 2 weeks.  If you have not heard from us then please contact us. The fastest way to get your results is to register for My Chart. ° ° °IF you received an x-ray today, you will receive an invoice from [facility]. Please contact [facility] at [phone] with questions or concerns regarding your invoice.  ° °IF you received labwork today, you will receive an invoice from [facility]. Please contact [facility] at [phone] with questions or concerns regarding your invoice.  ° °Our billing staff will not be able to assist you with questions regarding bills from these companies. ° °You will be contacted with the lab results as soon as they are available. The fastest way to get your results is to activate your My Chart account. Instructions are located on the last page of this paperwork. If you have not heard from us regarding the results in 2 weeks, please contact this office. °  ° ° ° °

## 2020-08-05 LAB — D-DIMER, QUANTITATIVE: D-DIMER: 0.25 mg/L FEU (ref 0.00–0.49)

## 2020-08-07 ENCOUNTER — Encounter: Payer: Self-pay | Admitting: Family Medicine

## 2020-08-13 LAB — FACTOR V LEIDEN

## 2020-08-13 LAB — ANTITHROMBIN III: AntiThromb III Func: 131 % (ref 75–135)

## 2020-08-13 LAB — PROTEIN C ACTIVITY: Protein C Activity: 132 % (ref 73–180)

## 2020-08-13 LAB — PROTEIN S ACTIVITY: Protein S Activity: 101 % (ref 63–140)

## 2020-08-13 LAB — PROTHROMBIN GENE MUTATION

## 2020-09-01 DIAGNOSIS — R293 Abnormal posture: Secondary | ICD-10-CM | POA: Diagnosis not present

## 2020-09-01 DIAGNOSIS — M791 Myalgia, unspecified site: Secondary | ICD-10-CM | POA: Diagnosis not present

## 2020-09-01 DIAGNOSIS — M25512 Pain in left shoulder: Secondary | ICD-10-CM | POA: Diagnosis not present

## 2020-09-01 DIAGNOSIS — M25522 Pain in left elbow: Secondary | ICD-10-CM | POA: Diagnosis not present

## 2020-09-04 DIAGNOSIS — M25522 Pain in left elbow: Secondary | ICD-10-CM | POA: Diagnosis not present

## 2020-09-04 DIAGNOSIS — R293 Abnormal posture: Secondary | ICD-10-CM | POA: Diagnosis not present

## 2020-09-04 DIAGNOSIS — M791 Myalgia, unspecified site: Secondary | ICD-10-CM | POA: Diagnosis not present

## 2020-09-04 DIAGNOSIS — M25512 Pain in left shoulder: Secondary | ICD-10-CM | POA: Diagnosis not present

## 2020-09-09 DIAGNOSIS — M25512 Pain in left shoulder: Secondary | ICD-10-CM | POA: Diagnosis not present

## 2020-09-09 DIAGNOSIS — M791 Myalgia, unspecified site: Secondary | ICD-10-CM | POA: Diagnosis not present

## 2020-09-09 DIAGNOSIS — R293 Abnormal posture: Secondary | ICD-10-CM | POA: Diagnosis not present

## 2020-09-09 DIAGNOSIS — M25522 Pain in left elbow: Secondary | ICD-10-CM | POA: Diagnosis not present

## 2020-09-15 DIAGNOSIS — M25512 Pain in left shoulder: Secondary | ICD-10-CM | POA: Diagnosis not present

## 2020-09-15 DIAGNOSIS — M25522 Pain in left elbow: Secondary | ICD-10-CM | POA: Diagnosis not present

## 2020-09-15 DIAGNOSIS — R293 Abnormal posture: Secondary | ICD-10-CM | POA: Diagnosis not present

## 2020-09-15 DIAGNOSIS — M791 Myalgia, unspecified site: Secondary | ICD-10-CM | POA: Diagnosis not present

## 2020-09-25 DIAGNOSIS — R293 Abnormal posture: Secondary | ICD-10-CM | POA: Diagnosis not present

## 2020-09-25 DIAGNOSIS — M25512 Pain in left shoulder: Secondary | ICD-10-CM | POA: Diagnosis not present

## 2020-09-25 DIAGNOSIS — M25522 Pain in left elbow: Secondary | ICD-10-CM | POA: Diagnosis not present

## 2020-09-25 DIAGNOSIS — M791 Myalgia, unspecified site: Secondary | ICD-10-CM | POA: Diagnosis not present

## 2020-11-03 DIAGNOSIS — Z20822 Contact with and (suspected) exposure to covid-19: Secondary | ICD-10-CM | POA: Diagnosis not present

## 2021-02-19 DIAGNOSIS — R293 Abnormal posture: Secondary | ICD-10-CM | POA: Diagnosis not present

## 2021-02-19 DIAGNOSIS — M791 Myalgia, unspecified site: Secondary | ICD-10-CM | POA: Diagnosis not present

## 2021-02-19 DIAGNOSIS — M25512 Pain in left shoulder: Secondary | ICD-10-CM | POA: Diagnosis not present

## 2021-02-19 DIAGNOSIS — M25522 Pain in left elbow: Secondary | ICD-10-CM | POA: Diagnosis not present

## 2021-03-24 DIAGNOSIS — B009 Herpesviral infection, unspecified: Secondary | ICD-10-CM | POA: Diagnosis not present

## 2021-06-15 DIAGNOSIS — L578 Other skin changes due to chronic exposure to nonionizing radiation: Secondary | ICD-10-CM | POA: Diagnosis not present

## 2021-06-15 DIAGNOSIS — L853 Xerosis cutis: Secondary | ICD-10-CM | POA: Diagnosis not present

## 2021-06-15 DIAGNOSIS — L814 Other melanin hyperpigmentation: Secondary | ICD-10-CM | POA: Diagnosis not present

## 2021-06-15 DIAGNOSIS — D225 Melanocytic nevi of trunk: Secondary | ICD-10-CM | POA: Diagnosis not present

## 2021-06-15 DIAGNOSIS — D485 Neoplasm of uncertain behavior of skin: Secondary | ICD-10-CM | POA: Diagnosis not present

## 2021-08-07 DIAGNOSIS — M25562 Pain in left knee: Secondary | ICD-10-CM | POA: Diagnosis not present

## 2021-08-14 DIAGNOSIS — D235 Other benign neoplasm of skin of trunk: Secondary | ICD-10-CM | POA: Diagnosis not present

## 2021-09-28 DIAGNOSIS — B009 Herpesviral infection, unspecified: Secondary | ICD-10-CM | POA: Diagnosis not present

## 2022-01-10 DIAGNOSIS — R0602 Shortness of breath: Secondary | ICD-10-CM | POA: Diagnosis not present

## 2022-01-10 DIAGNOSIS — Z20822 Contact with and (suspected) exposure to covid-19: Secondary | ICD-10-CM | POA: Diagnosis not present

## 2022-01-10 DIAGNOSIS — J029 Acute pharyngitis, unspecified: Secondary | ICD-10-CM | POA: Diagnosis not present

## 2022-02-05 DIAGNOSIS — M25512 Pain in left shoulder: Secondary | ICD-10-CM | POA: Diagnosis not present

## 2022-02-05 DIAGNOSIS — M25562 Pain in left knee: Secondary | ICD-10-CM | POA: Diagnosis not present

## 2022-02-05 DIAGNOSIS — M79671 Pain in right foot: Secondary | ICD-10-CM | POA: Diagnosis not present

## 2022-02-05 DIAGNOSIS — R2689 Other abnormalities of gait and mobility: Secondary | ICD-10-CM | POA: Diagnosis not present

## 2022-02-09 DIAGNOSIS — K600 Acute anal fissure: Secondary | ICD-10-CM | POA: Diagnosis not present

## 2022-02-09 DIAGNOSIS — K6289 Other specified diseases of anus and rectum: Secondary | ICD-10-CM | POA: Diagnosis not present

## 2022-02-10 DIAGNOSIS — M25512 Pain in left shoulder: Secondary | ICD-10-CM | POA: Diagnosis not present

## 2022-02-10 DIAGNOSIS — R2689 Other abnormalities of gait and mobility: Secondary | ICD-10-CM | POA: Diagnosis not present

## 2022-02-10 DIAGNOSIS — M25562 Pain in left knee: Secondary | ICD-10-CM | POA: Diagnosis not present

## 2022-02-10 DIAGNOSIS — M79671 Pain in right foot: Secondary | ICD-10-CM | POA: Diagnosis not present

## 2022-02-17 DIAGNOSIS — M25562 Pain in left knee: Secondary | ICD-10-CM | POA: Diagnosis not present

## 2022-02-17 DIAGNOSIS — M79671 Pain in right foot: Secondary | ICD-10-CM | POA: Diagnosis not present

## 2022-02-17 DIAGNOSIS — R2689 Other abnormalities of gait and mobility: Secondary | ICD-10-CM | POA: Diagnosis not present

## 2022-02-17 DIAGNOSIS — M25512 Pain in left shoulder: Secondary | ICD-10-CM | POA: Diagnosis not present

## 2022-02-22 ENCOUNTER — Encounter: Payer: Self-pay | Admitting: Physician Assistant

## 2022-03-02 ENCOUNTER — Encounter: Payer: Self-pay | Admitting: Physician Assistant

## 2022-03-02 ENCOUNTER — Ambulatory Visit: Payer: BC Managed Care – PPO | Admitting: Physician Assistant

## 2022-03-02 VITALS — BP 118/78 | HR 71 | Ht 76.0 in | Wt 240.0 lb

## 2022-03-02 DIAGNOSIS — K602 Anal fissure, unspecified: Secondary | ICD-10-CM | POA: Diagnosis not present

## 2022-03-02 DIAGNOSIS — K6289 Other specified diseases of anus and rectum: Secondary | ICD-10-CM | POA: Diagnosis not present

## 2022-03-02 MED ORDER — AMBULATORY NON FORMULARY MEDICATION: 1 refills | Status: DC

## 2022-03-02 NOTE — Patient Instructions (Signed)
If you are age 37 or older, your body mass index should be between 23-30. Your Body mass index is 29.21 kg/m?Marland Kitchen If this is out of the aforementioned range listed, please consider follow up with your Primary Care Provider. ? ?If you are age 43 or younger, your body mass index should be between 19-25. Your Body mass index is 29.21 kg/m?Marland Kitchen If this is out of the aformentioned range listed, please consider follow up with your Primary Care Provider.  ? ?________________________________________________________ ? ?The [facility] providers would like to encourage you to use [facility] to communicate with providers for non-urgent requests or questions.  Due to long hold times on the telephone, sending your provider a message by [facility] may be a faster and more efficient way to get a response.  Please allow 48 business hours for a response.  Please remember that this is for non-urgent requests.  ?_______________________________________________________ ? ?Your provider has prescribed Diltiazem compound cream for you. Please follow the directions written on your prescription bottle or given to you specifically by your provider. Since this is a specialty medication and is not readily available at most local pharmacies, we have sent your prescription to: ? ?[facility]'s information is below: ?Address: [address] [place], [place] [address]  ?[phone] ? ?*Please DO NOT go directly from our office to pick up this medication! Give the pharmacy 1 day to process the prescription as this is compounded and takes time to make. ? ?Purchase over the counter Recticare use this as needed. ? ?Use wet wipes to clean after a bowel movement and gently pat dry. ? ?Use a barrier cream like Desitin or Calmoseptine. ? ?Try Sitz baths. ? ?It was a pleasure to see you today! ? ?Thank you for trusting me with your gastrointestinal care!   ? ? ? ?

## 2022-03-02 NOTE — Progress Notes (Signed)
? ?Chief Complaint: Possible fissure ? ?HPI: ?   Tyler Howe is a 36 year old Caucasian male with a past medical history as listed below, who presents clinic today for complaint of a possible anal fissure. ?   Today, the patient tells me that over the past 2 to 3 months he has had some irritation around his rectum and seen some bright red blood in the toilet paper with wiping.  Initially he placed Aquaphor back there but it did not help.  Explains that he then went to see an urgent care provider and was diagnosed with an "anal fissure", he was started on Nifedipine/lidocaine cream 3 times daily.  He has been applying this to the outside of his rectum where he sees "multiple fissures", but tells me that nothing has gotten better and he feels constantly itchy and is going to the bathroom at least 4 times a day with a solid stool.  Tells me when all this started he had started back to weightlifting and has stopped working out completely over the past 3 weeks but again feels completely the same.  Describes distant history maybe in 2012 with some rectal pain and possible fissure then but thought this was due to weight lifting at the time. ?   Denies fever, chills, abdominal pain, weight loss or change in bowel habits. ? ?Past Medical History:  ?Diagnosis Date  ? GERD (gastroesophageal reflux disease)   ? ? ?Past Surgical History:  ?Procedure Laterality Date  ? ESOPHAGOGASTRODUODENOSCOPY    ? ? ?Current Outpatient Medications  ?Medication Sig Dispense Refill  ? AMBULATORY NON FORMULARY MEDICATION Diltiazem 2%  ?Apply rectally three times a day for 6-8 weeks 280 mL 1  ? ?No current facility-administered medications for this visit.  ? ? ?Allergies as of 03/02/2022  ? (No Known Allergies)  ? ? ?Family History  ?Problem Relation Age of Onset  ? Diabetes Mellitus II Mother   ? Diverticulitis Mother   ?     part of intestines removed  ? Gallstones Father   ? Pancreatitis Father   ? GER disease Brother   ? Colon cancer Neg Hx   ?  Esophageal cancer Neg Hx   ? ? ?Social History  ? ?Socioeconomic History  ? Marital status: Married  ?  Spouse name: Not on file  ? Number of children: 1  ? Years of education: Not on file  ? Highest education level: Not on file  ?Occupational History  ? Occupation: Finance  ?Tobacco Use  ? Smoking status: Never  ? Smokeless tobacco: Never  ?Vaping Use  ? Vaping Use: Never used  ?Substance and Sexual Activity  ? Alcohol use: No  ?  Alcohol/week: 0.0 standard drinks  ? Drug use: No  ? Sexual activity: Yes  ?  Partners: Female  ?Other Topics Concern  ? Not on file  ?Social History Narrative  ? Not on file  ? ?Social Determinants of Health  ? ?Financial Resource Strain: Not on file  ?Food Insecurity: Not on file  ?Transportation Needs: Not on file  ?Physical Activity: Not on file  ?Stress: Not on file  ?Social Connections: Not on file  ?Intimate Partner Violence: Not on file  ? ? ?Review of Systems:    ?Constitutional: No weight loss, fever or chills ?Skin: No rash ?Cardiovascular: No chest pain ?Respiratory: No SOB ?Gastrointestinal: See HPI and otherwise negative ?Genitourinary: No dysuria ?Neurological: No headache, dizziness or syncope ?Musculoskeletal: No new muscle or joint pain ?Hematologic: No bruising ?Psychiatric:  No history of depression or anxiety ? ? Physical Exam:  ?Vital signs: ?BP 118/78   Pulse 71   Ht 6\' 4"  (1.93 m)   Wt 240 lb (108.9 kg)   BMI 29.21 kg/m?  ? ?Constitutional:   Pleasant Caucasian male appears to be in NAD, Well developed, Well nourished, alert and cooperative ?Head:  Normocephalic and atraumatic. ?Eyes:   PEERL, EOMI. No icterus. Conjunctiva pink. ?Ears:  Normal auditory acuity. ?Neck:  Supple ?Throat: Oral cavity and pharynx without inflammation, swelling or lesion.  ?Respiratory: Respirations even and unlabored. Lungs clear to auscultation bilaterally.   No wheezes, crackles, or rhonchi.  ?Cardiovascular: Normal S1, S2. No MRG. Regular rate and rhythm. No peripheral edema,  cyanosis or pallor.  ?Gastrointestinal:  Soft, nondistended, nontender. No rebound or guarding. Normal bowel sounds. No appreciable masses or hepatomegaly. ?Rectal: External: Excoriation/small skin tears with some erythema around the outside of the rectum, very moist; internal: Posterior fissure very tender to palpation; anoscopy: Not done due to discomfort ?Msk:  Symmetrical without gross deformities. Without edema, no deformity or joint abnormality.  ?Neurologic:  Alert and  oriented x4;  grossly normal neurologically.  ?Skin:   Dry and intact without significant lesions or rashes. ?Psychiatric:  Demonstrates good judgement and reason without abnormal affect or behaviors. ? ?No recent labs or imaging. ? ?Assessment: ?1.  Posterior anal fissure: Seen at time of exam, very tender to palpation, likely source of some scant rectal bleeding ?2.  External anal excoriations: Likely from itching and dermatitis in this area from moisture ? ?Plan: ?1.  Discussed with patient that the "fissures" he is describing when he looks at them are more so skin tears around the outside of his rectum, these are likely not any better as he is keeping them moist with the fissure cream.  Would recommend that when he goes to the bathroom he wipes with a Cottonelle wet wipe and then dabs dry or air is dry and then places a barrier ointment such as Desitin or A&D ointment. ?2.  Patient does have a posterior fissure on exam which is tender.  Would recommend that we use Diltiazem 2% 3 times daily x6 to 8 weeks for this area only.  Discussed this specifically with the patient. ?3.  Recommend the patient buy over-the-counter RectiCare lidocaine to apply as needed to the fissure area in his rectum. ?4.  Discussed with patient that I do not think that working out will make his symptoms worse, but would avoid any heavy lifting right now, but certainly if they make him feel worse then he can avoid this for a few more weeks. ?5.  Patient to follow in  clinic with [place] as needed.  He will call and let us know if he is not getting any better over the next couple of weeks. Assigned to Dr. Candis Schatz today. ? ?Ellouise Newer, PA-C ?[facility] ?03/02/2022, 11:21 AM ?

## 2022-03-03 NOTE — Progress Notes (Signed)
Agree with the assessment and plan as outlined by Jennifer Lemmon, PA-C. ? ?Darienne Belleau E. Malikai Gut, MD ? ?

## 2022-04-09 DIAGNOSIS — F4320 Adjustment disorder, unspecified: Secondary | ICD-10-CM | POA: Diagnosis not present

## 2022-04-16 DIAGNOSIS — F4320 Adjustment disorder, unspecified: Secondary | ICD-10-CM | POA: Diagnosis not present

## 2022-04-19 DIAGNOSIS — M7732 Calcaneal spur, left foot: Secondary | ICD-10-CM | POA: Diagnosis not present

## 2022-04-19 DIAGNOSIS — M722 Plantar fascial fibromatosis: Secondary | ICD-10-CM | POA: Diagnosis not present

## 2022-05-07 DIAGNOSIS — F4320 Adjustment disorder, unspecified: Secondary | ICD-10-CM | POA: Diagnosis not present

## 2022-05-11 ENCOUNTER — Ambulatory Visit
Admission: RE | Admit: 2022-05-11 | Discharge: 2022-05-11 | Disposition: A | Discharge status: Home / Self Care | Payer: BC Managed Care – PPO | Source: Ambulatory Visit | Attending: Sports Medicine | Admitting: Sports Medicine

## 2022-05-11 ENCOUNTER — Ambulatory Visit: Payer: BC Managed Care – PPO | Admitting: Sports Medicine

## 2022-05-11 ENCOUNTER — Encounter: Payer: Self-pay | Admitting: Sports Medicine

## 2022-05-11 VITALS — BP 130/84 | Ht 76.0 in | Wt 241.0 lb

## 2022-05-11 DIAGNOSIS — M722 Plantar fascial fibromatosis: Secondary | ICD-10-CM

## 2022-05-11 DIAGNOSIS — M79605 Pain in left leg: Secondary | ICD-10-CM

## 2022-05-11 DIAGNOSIS — M545 Low back pain, unspecified: Secondary | ICD-10-CM | POA: Diagnosis not present

## 2022-05-11 DIAGNOSIS — M25562 Pain in left knee: Secondary | ICD-10-CM

## 2022-05-11 DIAGNOSIS — R29898 Other symptoms and signs involving the musculoskeletal system: Secondary | ICD-10-CM | POA: Diagnosis not present

## 2022-05-11 DIAGNOSIS — G8929 Other chronic pain: Secondary | ICD-10-CM

## 2022-05-12 NOTE — Progress Notes (Addendum)
   Subjective:    Patient ID: Tyler Howe, male    DOB: 1986-08-29, 36 y.o.   MRN: [id number]  HPI chief complaint: Left knee, left calf, and right heel pain  [name] presents today with a couple of different complaints.  First complaint is chronic anterior left knee pain.  He suffered a rather significant quadriceps muscle tear in 2013 in the [employer].  Shortly thereafter he began to experience knee pain.  His pain has been present intermittently for years.  He localizes it to the patellar tendon and has had several forms of treatment over the year including physical therapy.  He also has chronic left calf cramping which is present with running.  He has been treated in this office in the past for this issue but unfortunately it continues to interfere with his ability to be active.  He does notice some weakness in the leg as well.  He has numbness in the great toe which has been present since his days in the [employer].  Denies numbness elsewhere through the lower leg.  He also has a history of left foot Planter fasciitis and ankle tendinosis which are currently stable.  Past medical history reviewed Medications reviewed Allergies reviewed    Review of Systems As above    Objective:   Physical Exam  Well-developed, well-nourished.  No acute distress  Left leg: There is a palpable defect in the proximal quadriceps muscle.  Good strength.  Left knee: Full range of motion.  No effusion.  There is tenderness to palpation along the patellar tendon.  Negative McMurray's.  Knee is stable to valgus and varus stressing.  Negative anterior drawer, negative posterior drawer.  Right heel: Tenderness to palpation at the calcaneal origin of the plantar fascia.  Negative calcaneal squeeze.  No soft tissue swelling.  Neurological exam: There is 4+/5 strength with resisted plantarflexion and great toe extension on the left compared to the right.  Reflexes are 1/4 and equal at the Achilles and patellar  tendons bilaterally.  No calf atrophy.  Good pulses distally.      Assessment & Plan:   Chronic left calf cramping with weakness Chronic left knee pain History of left quadriceps muscle tear Right foot Planter fasciitis likely secondary to altered gait  I recommended x-rays and an MRI of the lumbar spine to rule out a possible lumbar disc herniation which may be responsible for Mike's chronic left calf pain.  Phone follow-up with those results when available.  [name] is also requesting a letter on his behalf addressed to the veterans administration.  It is my opinion that his quadriceps muscle tear many years ago has led to his chronic left knee pain and new onset right Planter fasciitis.  I will write a letter on his behalf stating this.  He is well versed in treatment of Planter fasciitis and have encouraged him to continue his treatment on his right foot.  This note was dictated using Dragon naturally speaking software and may contain errors in syntax, spelling, or content which have not been identified prior to signing this note.   Addendum: X-ray of the lumbar spine shows mild degenerative disc space narrowing at L5-S1.  Otherwise unremarkable.

## 2022-05-13 ENCOUNTER — Other Ambulatory Visit: Payer: BC Managed Care – PPO

## 2022-05-31 DIAGNOSIS — M722 Plantar fascial fibromatosis: Secondary | ICD-10-CM | POA: Diagnosis not present

## 2022-06-01 DIAGNOSIS — F4320 Adjustment disorder, unspecified: Secondary | ICD-10-CM | POA: Diagnosis not present

## 2022-06-18 DIAGNOSIS — L814 Other melanin hyperpigmentation: Secondary | ICD-10-CM | POA: Diagnosis not present

## 2022-06-18 DIAGNOSIS — D2271 Melanocytic nevi of right lower limb, including hip: Secondary | ICD-10-CM | POA: Diagnosis not present

## 2022-06-18 DIAGNOSIS — L578 Other skin changes due to chronic exposure to nonionizing radiation: Secondary | ICD-10-CM | POA: Diagnosis not present

## 2022-06-18 DIAGNOSIS — D225 Melanocytic nevi of trunk: Secondary | ICD-10-CM | POA: Diagnosis not present

## 2022-07-02 DIAGNOSIS — F4320 Adjustment disorder, unspecified: Secondary | ICD-10-CM | POA: Diagnosis not present

## 2022-07-09 DIAGNOSIS — F4320 Adjustment disorder, unspecified: Secondary | ICD-10-CM | POA: Diagnosis not present

## 2022-07-19 DIAGNOSIS — M545 Low back pain, unspecified: Secondary | ICD-10-CM | POA: Diagnosis not present

## 2022-07-19 DIAGNOSIS — M722 Plantar fascial fibromatosis: Secondary | ICD-10-CM | POA: Diagnosis not present

## 2022-07-19 DIAGNOSIS — R5383 Other fatigue: Secondary | ICD-10-CM | POA: Diagnosis not present

## 2022-07-19 DIAGNOSIS — G8929 Other chronic pain: Secondary | ICD-10-CM | POA: Diagnosis not present

## 2022-07-19 DIAGNOSIS — M47897 Other spondylosis, lumbosacral region: Secondary | ICD-10-CM | POA: Diagnosis not present

## 2022-07-22 DIAGNOSIS — M545 Low back pain, unspecified: Secondary | ICD-10-CM | POA: Diagnosis not present

## 2022-07-22 DIAGNOSIS — G8929 Other chronic pain: Secondary | ICD-10-CM | POA: Diagnosis not present

## 2022-07-22 DIAGNOSIS — M722 Plantar fascial fibromatosis: Secondary | ICD-10-CM | POA: Diagnosis not present

## 2022-08-27 DIAGNOSIS — F4320 Adjustment disorder, unspecified: Secondary | ICD-10-CM | POA: Diagnosis not present

## 2022-09-01 DIAGNOSIS — A084 Viral intestinal infection, unspecified: Secondary | ICD-10-CM | POA: Diagnosis not present

## 2022-09-01 DIAGNOSIS — M79672 Pain in left foot: Secondary | ICD-10-CM | POA: Diagnosis not present

## 2022-09-01 DIAGNOSIS — M79671 Pain in right foot: Secondary | ICD-10-CM | POA: Diagnosis not present

## 2022-09-08 DIAGNOSIS — M545 Low back pain, unspecified: Secondary | ICD-10-CM | POA: Diagnosis not present

## 2022-09-08 DIAGNOSIS — B001 Herpesviral vesicular dermatitis: Secondary | ICD-10-CM | POA: Diagnosis not present

## 2022-09-27 ENCOUNTER — Encounter: Payer: Self-pay | Admitting: Sports Medicine

## 2022-10-05 DIAGNOSIS — M722 Plantar fascial fibromatosis: Secondary | ICD-10-CM | POA: Diagnosis not present

## 2022-12-01 ENCOUNTER — Encounter: Payer: Self-pay | Admitting: *Deleted

## 2022-12-10 ENCOUNTER — Encounter: Payer: Self-pay | Admitting: Physician Assistant

## 2022-12-10 ENCOUNTER — Ambulatory Visit: Payer: BC Managed Care – PPO | Admitting: Physician Assistant

## 2022-12-10 VITALS — BP 100/78 | HR 73 | Ht 76.0 in | Wt 245.0 lb

## 2022-12-10 DIAGNOSIS — R143 Flatulence: Secondary | ICD-10-CM | POA: Diagnosis not present

## 2022-12-10 DIAGNOSIS — R194 Change in bowel habit: Secondary | ICD-10-CM | POA: Diagnosis not present

## 2022-12-10 DIAGNOSIS — L290 Pruritus ani: Secondary | ICD-10-CM

## 2022-12-10 MED ORDER — PLENVU 140 G PO SOLR: 1.0000 | ORAL | 0 refills | Status: DC

## 2022-12-10 MED ORDER — NYSTATIN-TRIAMCINOLONE 100000-0.1 UNIT/GM-% EX CREA: TOPICAL_CREAM | CUTANEOUS | 0 refills | Status: DC

## 2022-12-10 NOTE — Patient Instructions (Signed)
You have been scheduled for a colonoscopy. Please follow written instructions given to you at your visit today.  Please pick up your prep supplies at the pharmacy within the next 1-3 days. If you use inhalers (even only as needed), please bring them with you on the day of your procedure.  We have sent the following medications to your pharmacy for you to pick up at your convenience: Mycolog II cream-apply small amount to the rectum twice daily x 2 weeks (call our office if this does not help your symptoms and we will get you in with dermatology)  _______________________________________________________  If you are age 5 or older, your body mass index should be between 23-30. Your Body mass index is 29.82 kg/m. If this is out of the aforementioned range listed, please consider follow up with your Primary Care Provider.  If you are age 70 or younger, your body mass index should be between 19-25. Your Body mass index is 29.82 kg/m. If this is out of the aformentioned range listed, please consider follow up with your Primary Care Provider.   ________________________________________________________  The [facility] providers would like to encourage you to use [facility] to communicate with providers for non-urgent requests or questions.  Due to long hold times on the telephone, sending your provider a message by [facility] may be a faster and more efficient way to get a response.  Please allow 48 business hours for a response.  Please remember that this is for non-urgent requests.  _______________________________________________________  Due to recent changes in healthcare laws, you may see the results of your imaging and laboratory studies on MyChart before your provider has had a chance to review them.  We understand that in some cases there may be results that are confusing or concerning to you. Not all laboratory results come back in the same time frame and the provider may be waiting for multiple  results in order to interpret others.  Please give [place] 48 hours in order for your provider to thoroughly review all the results before contacting the office for clarification of your results.

## 2022-12-10 NOTE — Progress Notes (Signed)
Chief Complaint: Change in bowel habits, rectal itching and gas  HPI:    Tyler Howe is a 37 year old male with a past medical history as listed below including reflux and rectal fissure, signed to Dr. Candis Schatz, who returns to clinic today with a complaint of    03/02/2022 patient seen in clinic with rectal irritation and bright red blood on the toilet paper.  At that time posterior fissure.  At that time recommended Desitin or A&E around the rectum for excoriations and Diltiazem for fissure.    Today, patient presents to clinic accompanied by his wife who assists with history.  He explains that he treated his fissure with the Diltiazem for weeks and felt like the pain went away and has no longer had pain but was using a cream for the dermatitis around his rectum and feels like it has not helped at all.  He has had progressive increase in itching over the past 6 to 7 months and feels like there are still tears back there which he has to clean very well when he goes to the bathroom, does not feel like anything is really helping.      Along with this has noticed a change in his bowel habits telling me they seem somewhat inconsistent over the past 6 to 7 months radiating from sometimes urgent diarrhea to other times constipation and sometimes regular stools.  Denies any increase in stress or anxiety or changes in diet and "we eat pretty healthy".  No changes in medications.   Also concerning to patient's wife is his foul-smelling gas, she tells me he has always had a lot of gas and it smells like eggs.  He does admit to using preworkout and protein supplements and has done this for years.    Patient relays a family history of diverticulitis and surgeries as well as trouble with gallbladders in the past.  "My family is had a lot of GI issues", no specific GI cancers.    Denies fever, chills, weight loss, blood in his stool or symptoms that awaken him from sleep.  Past Medical History:  Diagnosis Date    GERD (gastroesophageal reflux disease)    Rectal fissure    Testosterone deficiency     Past Surgical History:  Procedure Laterality Date   ESOPHAGOGASTRODUODENOSCOPY      Current Outpatient Medications  Medication Sig Dispense Refill   AMBULATORY NON FORMULARY MEDICATION Diltiazem 2%  Apply rectally three times a day for 6-8 weeks (Patient not taking: Reported on 12/10/2022) 280 mL 1   No current facility-administered medications for this visit.    Allergies as of 12/10/2022   (No Known Allergies)    Family History  Problem Relation Age of Onset   Diabetes Mellitus II Mother    Diverticulitis Mother        part of intestines removed   Gallstones Father    Pancreatitis Father    GER disease Brother    Colon cancer Neg Hx    Esophageal cancer Neg Hx     Social History   Socioeconomic History   Marital status: Married    Spouse name: Not on file   Number of children: 1   Years of education: Not on file   Highest education level: Not on file  Occupational History   Occupation: Finance  Tobacco Use   Smoking status: Never   Smokeless tobacco: Never  Vaping Use   Vaping Use: Never used  Substance and Sexual Activity  Alcohol use: No    Alcohol/week: 0.0 standard drinks of alcohol   Drug use: No   Sexual activity: Yes    Partners: Female  Other Topics Concern   Not on file  Social History Narrative   Not on file   Social Determinants of Health   Financial Resource Strain: Not on file  Food Insecurity: Not on file  Transportation Needs: Not on file  Physical Activity: Not on file  Stress: Not on file  Social Connections: Not on file  Intimate Partner Violence: Not on file    Review of Systems:    Constitutional: No weight loss, fever or chills Cardiovascular: No chest pain Respiratory: No SOB Gastrointestinal: See HPI and otherwise negative   Physical Exam:  Vital signs: BP 100/78   Pulse 73   Ht 6\' 4"  (1.93 m)   Wt 245 lb (111.1 kg)   BMI  29.82 kg/m    Constitutional:   Pleasant Caucasian male appears to be in NAD, Well developed, Well nourished, alert and cooperative Respiratory: Respirations even and unlabored. Lungs clear to auscultation bilaterally.   No wheezes, crackles, or rhonchi.  Cardiovascular: Normal S1, S2. No MRG. Regular rate and rhythm. No peripheral edema, cyanosis or pallor.  Gastrointestinal:  Soft, nondistended, nontender. No rebound or guarding. Normal bowel sounds. No appreciable masses or hepatomegaly. Rectal: External: Thickened skin around the rectum which is erythematous with excoriations and fissures, somewhat white film to some of it; internal: No tenderness no fissure appreciated Psychiatric:Demonstrates good judgement and reason without abnormal affect or behaviors.  No recent labs or imaging.  Assessment: 1.  Rectal itching: Visible dermatitis with concern for Candida infection and skin thickening with excoriations 2.  Change in bowel habits: Variance in stools now where he used to be regular over the past 6 to 7 months; consider IBS versus diet versus stress versus other 3.  Gas: Likely related to diet i.e. protein supplements +/- SIBO  Plan: 1.  Discussed with patient that we will try to treat his rectal dermatitis differently.  Prescribed Mycolog cream twice daily for the next 2 weeks.  Discussed that if this does not help significantly then he will need referral to a dermatologist for further evaluation and possible skin biopsy. 2.  Due to change in bowel habits and patient's concern about his family history scheduled him for a diagnostic colonoscopy in the [place] with Dr. Candis Schatz.  Did provide the patient a detailed list of risks for the procedure and he agrees to proceed. 3.  Did discuss the possibility of SIBO for patient's gas, he is not that concerned about it as he has no abdominal complaints or discomfort or bloating, his wife tells me that it makes sense that it would have to do with his  protein supplements.  They will call back if they want testing. 4.  Patient to follow in clinic per recommendations after time of procedure.  Ellouise Newer, PA-C [facility] 12/10/2022, 10:08 AM

## 2022-12-11 NOTE — Progress Notes (Signed)
Agree with the assessment and plan as outlined by Ellouise Newer, PA-C.  History and description of patient's perineum may be more consistent with lichen simplex chronicus.  If no improvement with antifungal, would consider trial of topical steroids and then referral to dermatology if not improving with topical steroids.  Patient should avoid scratching/excessive mechanical irritation to the area.  Kentaro Alewine E. Candis Schatz, MD

## 2022-12-16 DIAGNOSIS — M542 Cervicalgia: Secondary | ICD-10-CM | POA: Diagnosis not present

## 2022-12-16 DIAGNOSIS — R194 Change in bowel habit: Secondary | ICD-10-CM | POA: Diagnosis not present

## 2022-12-16 DIAGNOSIS — M9961 Osseous and subluxation stenosis of intervertebral foramina of cervical region: Secondary | ICD-10-CM | POA: Diagnosis not present

## 2022-12-22 DIAGNOSIS — M542 Cervicalgia: Secondary | ICD-10-CM | POA: Diagnosis not present

## 2022-12-30 DIAGNOSIS — M542 Cervicalgia: Secondary | ICD-10-CM | POA: Diagnosis not present

## 2023-01-06 DIAGNOSIS — R197 Diarrhea, unspecified: Secondary | ICD-10-CM | POA: Diagnosis not present

## 2023-01-06 DIAGNOSIS — K602 Anal fissure, unspecified: Secondary | ICD-10-CM | POA: Diagnosis not present

## 2023-01-07 DIAGNOSIS — M542 Cervicalgia: Secondary | ICD-10-CM | POA: Diagnosis not present

## 2023-01-07 DIAGNOSIS — R194 Change in bowel habit: Secondary | ICD-10-CM | POA: Diagnosis not present

## 2023-01-07 DIAGNOSIS — R197 Diarrhea, unspecified: Secondary | ICD-10-CM | POA: Diagnosis not present

## 2023-01-12 DIAGNOSIS — M545 Low back pain, unspecified: Secondary | ICD-10-CM | POA: Diagnosis not present

## 2023-01-12 DIAGNOSIS — E291 Testicular hypofunction: Secondary | ICD-10-CM | POA: Diagnosis not present

## 2023-01-14 DIAGNOSIS — M542 Cervicalgia: Secondary | ICD-10-CM | POA: Diagnosis not present

## 2023-01-14 DIAGNOSIS — R7989 Other specified abnormal findings of blood chemistry: Secondary | ICD-10-CM | POA: Diagnosis not present

## 2023-01-14 DIAGNOSIS — M545 Low back pain, unspecified: Secondary | ICD-10-CM | POA: Diagnosis not present

## 2023-01-21 DIAGNOSIS — M542 Cervicalgia: Secondary | ICD-10-CM | POA: Diagnosis not present

## 2023-02-02 DIAGNOSIS — M898X2 Other specified disorders of bone, upper arm: Secondary | ICD-10-CM | POA: Diagnosis not present

## 2023-02-02 DIAGNOSIS — M25521 Pain in right elbow: Secondary | ICD-10-CM | POA: Diagnosis not present

## 2023-02-07 ENCOUNTER — Encounter: Payer: BC Managed Care – PPO | Admitting: Gastroenterology

## 2023-03-09 DIAGNOSIS — M25462 Effusion, left knee: Secondary | ICD-10-CM | POA: Diagnosis not present

## 2023-03-10 ENCOUNTER — Encounter: Payer: Self-pay | Admitting: Gastroenterology

## 2023-03-22 ENCOUNTER — Encounter: Payer: Self-pay | Admitting: Gastroenterology

## 2023-03-22 ENCOUNTER — Ambulatory Visit (AMBULATORY_SURGERY_CENTER): Payer: No Typology Code available for payment source | Admitting: Gastroenterology

## 2023-03-22 VITALS — BP 122/70 | HR 62 | Temp 98.9°F | Resp 17 | Ht 76.0 in | Wt 245.0 lb

## 2023-03-22 DIAGNOSIS — K6389 Other specified diseases of intestine: Secondary | ICD-10-CM | POA: Diagnosis not present

## 2023-03-22 DIAGNOSIS — L290 Pruritus ani: Secondary | ICD-10-CM | POA: Diagnosis not present

## 2023-03-22 DIAGNOSIS — R194 Change in bowel habit: Secondary | ICD-10-CM | POA: Diagnosis not present

## 2023-03-22 MED ORDER — SODIUM CHLORIDE 0.9 % IV SOLN
500.0000 mL | Freq: Once | INTRAVENOUS | Status: DC
Start: 2023-03-22 — End: 2023-03-22

## 2023-03-22 NOTE — Progress Notes (Signed)
Called to room to assist during endoscopic procedure.  Patient ID and intended procedure confirmed with present staff. Received instructions for my participation in the procedure from the performing physician.  

## 2023-03-22 NOTE — Progress Notes (Signed)
[facility] History and Physical   Primary Care Physician:  Pcp, No   Reason for Procedure:   Change in bowel habits, chronic anal fissure  Plan:    Colonoscopy     HPI: Tyler Howe is a 37 y.o. male undergoing colonoscopy because of recent change in bowel habits (frequent poorly formed stools).  He has has been diagnosed with an anal fissure and continues to have symptoms.  He has no family history of colon cancer that he is aware of.  Past Medical History:  Diagnosis Date   GERD (gastroesophageal reflux disease)    Rectal fissure    Testosterone deficiency     Past Surgical History:  Procedure Laterality Date   ESOPHAGOGASTRODUODENOSCOPY      Prior to Admission medications   Medication Sig Start Date End Date Taking? Authorizing Provider  AMBULATORY NON FORMULARY MEDICATION Diltiazem 2%  Apply rectally three times a day for 6-8 weeks Patient not taking: Reported on 12/10/2022 03/02/22   Unk Lightning, PA  diclofenac Sodium (VOLTAREN) 1 % GEL APPLY 4 GRAMS TO AFFECTED AREA TWICE A DAY FOR FOOT PAIN-- APPLY TO PAINFUL AREAS OF BOTH FEET 10/05/22   [provider]  nystatin-triamcinolone (MYCOLOG II) cream Apply a small amount to outside of rectum twice daily x 2 weeks Patient not taking: Reported on 03/22/2023 12/10/22   Unk Lightning, PA    Current Outpatient Medications  Medication Sig Dispense Refill   AMBULATORY NON FORMULARY MEDICATION Diltiazem 2%  Apply rectally three times a day for 6-8 weeks (Patient not taking: Reported on 12/10/2022) 280 mL 1   diclofenac Sodium (VOLTAREN) 1 % GEL APPLY 4 GRAMS TO AFFECTED AREA TWICE A DAY FOR FOOT PAIN-- APPLY TO PAINFUL AREAS OF BOTH FEET     nystatin-triamcinolone (MYCOLOG II) cream Apply a small amount to outside of rectum twice daily x 2 weeks (Patient not taking: Reported on 03/22/2023) 30 g 0   Current Facility-Administered Medications  Medication Dose Route Frequency Provider Last Rate  Last Admin   0.9 %  sodium chloride infusion  500 mL Intravenous Once Jenel Lucks, MD        Allergies as of 03/22/2023   (No Known Allergies)    Family History  Problem Relation Age of Onset   Diabetes Mellitus II Mother    Diverticulitis Mother        part of intestines removed   Gallstones Father    Pancreatitis Father    GER disease Brother    Colon cancer Neg Hx    Esophageal cancer Neg Hx    Stomach cancer Neg Hx    Rectal cancer Neg Hx     Social History   Socioeconomic History   Marital status: Married    Spouse name: Not on file   Number of children: 1   Years of education: Not on file   Highest education level: Not on file  Occupational History   Occupation: Finance  Tobacco Use   Smoking status: Never   Smokeless tobacco: Never  Vaping Use   Vaping Use: Never used  Substance and Sexual Activity   Alcohol use: Yes    Comment: occasional   Drug use: No   Sexual activity: Yes    Partners: Female  Other Topics Concern   Not on file  Social History Narrative   Not on file   Social Determinants of Health   Financial Resource Strain: Not on file  Food Insecurity: Not on file  Transportation Needs: Not on file  Physical Activity: Not on file  Stress: Not on file  Social Connections: Not on file  Intimate Partner Violence: Not on file    Review of Systems:  All other review of systems negative except as mentioned in the HPI.  Physical Exam: Vital signs BP 136/78   Pulse 85   Temp 98.9 F (37.2 C)   Resp 11   Ht  (1.93 m)   Wt 245 lb (111.1 kg)   SpO2 97%   BMI 29.82 kg/m   General:   Alert,  Well-developed, well-nourished, pleasant and cooperative in NAD Airway:  Mallampati 2 Lungs:  Clear throughout to auscultation.   Heart:  Regular rate and rhythm; no murmurs, clicks, rubs,  or gallops. Abdomen:  Soft, nontender and nondistended. Normal bowel sounds.   Neuro/Psych:  Normal mood and affect. A and O x 3   Kaslyn Richburg E.  Tomasa Rand, MD [facility]

## 2023-03-22 NOTE — Progress Notes (Signed)
A and O x3. Report to RN. Tolerated MAC anesthesia well. 

## 2023-03-22 NOTE — Patient Instructions (Signed)
Resume previous diet Continue present medications Await pathology results There were no polyps seen today!  You will need another screening colonoscopy in 10 years, you will receive a letter at that time when you are due for the procedure.    Please call us at [phone] if you have a change in bowel habits, change in family history of colo-rectal cancer, rectal bleeding or other GI concern before that time. Recommend barrier cream and avoidance of any mechanical irritation of the anal skin to help with healing. (Aquaphor ointment or desitin,etc)  YOU HAD AN ENDOSCOPIC PROCEDURE TODAY AT THE [facility]:   Refer to the procedure report that was given to you for any specific questions about what was found during the examination.  If the procedure report does not answer your questions, please call your gastroenterologist to clarify.  If you requested that your care partner not be given the details of your procedure findings, then the procedure report has been included in a sealed envelope for you to review at your convenience later.  YOU SHOULD EXPECT: Some feelings of bloating in the abdomen. Passage of more gas than usual.  Walking can help get rid of the air that was put into your GI tract during the procedure and reduce the bloating. If you had a lower endoscopy (such as a colonoscopy or flexible sigmoidoscopy) you may notice spotting of blood in your stool or on the toilet paper. If you underwent a bowel prep for your procedure, you may not have a normal bowel movement for a few days.  Please Note:  You might notice some irritation and congestion in your nose or some drainage.  This is from the oxygen used during your procedure.  There is no need for concern and it should clear up in a day or so.  SYMPTOMS TO REPORT IMMEDIATELY:  Following lower endoscopy (colonoscopy):  Excessive amounts of blood in the stool  Significant tenderness or worsening of abdominal pains  Swelling of  the abdomen that is new, acute  Fever of 100F or higher  For urgent or emergent issues, a gastroenterologist can be reached at any hour by calling [phone]. Do not use MyChart messaging for urgent concerns.   DIET:  We do recommend a small meal at first, but then you may proceed to your regular diet.  Drink plenty of fluids but you should avoid alcoholic beverages for 24 hours.  ACTIVITY:  You should plan to take it easy for the rest of today and you should NOT DRIVE or use heavy machinery until tomorrow (because of the sedation medicines used during the test).    FOLLOW UP: Our staff will call the number listed on your records the next business day following your procedure.  We will call around 7:15- 8:00 am to check on you and address any questions or concerns that you may have regarding the information given to you following your procedure. If we do not reach you, we will leave a message.     If any biopsies were taken you will be contacted by phone or by letter within the next 1-3 weeks.  Please call us at [phone] if you have not heard about the biopsies in 3 weeks.    SIGNATURES/CONFIDENTIALITY: You and/or your care partner have signed paperwork which will be entered into your electronic medical record.  These signatures attest to the fact that that the information above on your After Visit Summary has been reviewed and is understood.  Full responsibility of the confidentiality of this discharge information lies with you and/or your care-partner.

## 2023-03-22 NOTE — Op Note (Addendum)
[facility] Patient Name: Tyler Howe Procedure Date: 03/22/2023 10:26 AM MRN: [id number] Endoscopist: Lorin Picket E. Tomasa Rand , MD, [id number] Age: 37 Referring MD:  Date of Birth: Sep 14, 1986 Gender: Male Account #: [id number] Procedure:                Colonoscopy Indications:              Chronic diarrhea, Change in bowel habits Medicines:                Monitored Anesthesia Care Procedure:                Pre-Anesthesia Assessment:                           - Prior to the procedure, a History and Physical                            was performed, and patient medications and                            allergies were reviewed. The patient's tolerance of                            previous anesthesia was also reviewed. The risks                            and benefits of the procedure and the sedation                            options and risks were discussed with the patient.                            All questions were answered, and informed consent                            was obtained. Prior Anticoagulants: The patient has                            taken no anticoagulant or antiplatelet agents. ASA                            Grade Assessment: II - A patient with mild systemic                            disease. After reviewing the risks and benefits,                            the patient was deemed in satisfactory condition to                            undergo the procedure.                           After obtaining informed consent, the colonoscope  was passed under direct vision. Throughout the                            procedure, the patient's blood pressure, pulse, and                            oxygen saturations were monitored continuously. The                            CF [id number] #[id number] was introduced through the anus                            and advanced to the the terminal ileum, with                            identification  of the appendiceal orifice and IC                            valve. The colonoscopy was somewhat difficult due                            to a redundant colon. Successful completion of the                            procedure was aided by using manual pressure. The                            patient tolerated the procedure well. The quality                            of the bowel preparation was excellent. The                            terminal ileum, ileocecal valve, appendiceal                            orifice, and rectum were photographed. The bowel                            preparation used was SUPREP via split dose                            instruction. Scope In: 10:43:15 AM Scope Out: 11:02:51 AM Scope Withdrawal Time: 0 hours 12 minutes 40 seconds  Total Procedure Duration: 0 hours 19 minutes 36 seconds  Findings:                 The perianal exam findings include excoriated skin                            with linear skin cracks/breaks.                           The digital rectal exam was normal. Pertinent  negatives include normal sphincter tone and no                            palpable rectal lesions.                           The colon (entire examined portion) appeared                            normal. Biopsies for histology were taken with a                            cold forceps from the right colon and left colon                            for evaluation of microscopic colitis. Estimated                            blood loss was minimal.                           The terminal ileum contained three non-bleeding                            aphthae. No stigmata of recent bleeding were seen.                            Biopsies were taken with a cold forceps for                            histology. Estimated blood loss was minimal.                           The remainder of the exam in the terminal ileum was                             normal.                           The retroflexed view of the distal rectum and anal                            verge was normal and showed no anal or rectal                            abnormalities. Complications:            No immediate complications. Estimated Blood Loss:     Estimated blood loss was minimal. Estimated blood                            loss was minimal. Impression:               - Excoriated skin found on perianal exam, but no  lichenification appreciated.                           - The entire examined colon is normal. Biopsied.                           - Aphtha in the terminal ileum. Biopsied. While                            Crohn's disease is certainly possible, the very                            mild nature of the aphtha and otherwise completely                            normal lower GI tract make Crohn's disease less                            likely.                           - The distal rectum and anal verge are normal on                            retroflexion view.                           - The GI Genius (intelligent endoscopy module),                            computer-aided polyp detection system powered by AI                            was utilized to detect colorectal polyps through                            enhanced visualization during colonoscopy. Recommendation:           - Patient has a contact number available for                            emergencies. The signs and symptoms of potential                            delayed complications were discussed with the                            patient. Return to normal activities tomorrow.                            Written discharge instructions were provided to the                            patient.                           -  Resume previous diet.                           - Continue present medications.                           - Await pathology results. Further  recommendations                            based on pathology results.                           - Repeat colonoscopy in 10 years for screening                            purposes.                           - Recommend barrier cream and avoidance of any                            mechanical irritation of the anal skin to help with                            healing. Eshani Maestre E. Tomasa Rand, MD 03/22/2023 11:13:59 AM This report has been signed electronically.

## 2023-03-23 ENCOUNTER — Telehealth: Payer: Self-pay | Admitting: *Deleted

## 2023-03-23 NOTE — Telephone Encounter (Signed)
Left message on f/u call 

## 2023-03-28 ENCOUNTER — Other Ambulatory Visit (INDEPENDENT_AMBULATORY_CARE_PROVIDER_SITE_OTHER): Payer: No Typology Code available for payment source

## 2023-03-28 ENCOUNTER — Other Ambulatory Visit: Payer: Self-pay

## 2023-03-28 DIAGNOSIS — R194 Change in bowel habit: Secondary | ICD-10-CM

## 2023-03-28 DIAGNOSIS — K602 Anal fissure, unspecified: Secondary | ICD-10-CM

## 2023-03-28 LAB — C-REACTIVE PROTEIN: CRP: <1 mg/dL (ref 0.5–20.0)

## 2023-03-28 LAB — FOLATE: Folate: 11.8 ng/mL (ref >5.9)

## 2023-03-28 LAB — VITAMIN B12: Vitamin B-12: 528 pg/mL (ref 211–911)

## 2023-03-28 LAB — SEDIMENTATION RATE: Sed Rate: 10 mm/hr (ref 0–15)

## 2023-03-28 NOTE — Progress Notes (Signed)
Tyler Howe,  The biopsies of the small ulcers in your terminal ileum were unremarkable.  There was no evidence of chronic inflammation or other findings to suggest Crohn's disease.  The biopsies of your colon were also unremarkable.   Although I think it is very unlikely you have Crohn's disease, I would like to get some additional blood tests which can be suggestive of Crohn's disease.  Please come by the lab at our office when you have time.  You do not need to be fasting.  Linda,  Please place orders for IBD serology panel, ESR, CRP, B12/folate

## 2023-03-29 LAB — CROHN'S IBDX PROGNOSTIC PANEL
ACCA: 12 units (ref 0–90)
ALCA: 45 units (ref 0–60)
AMCA: 33 units (ref 0–100)
GASCA: 14 units (ref 0–50)

## 2023-04-04 NOTE — Progress Notes (Signed)
Tyler Howe,  All of your blood tests were unremarkable.  I think it is very unlikely that you have Crohn's disease and do not recommend any further evaluation at this time, but would be happy to discuss with you further in clinic if desired.

## 2023-06-28 DIAGNOSIS — R194 Change in bowel habit: Secondary | ICD-10-CM | POA: Diagnosis not present

## 2023-06-28 DIAGNOSIS — K219 Gastro-esophageal reflux disease without esophagitis: Secondary | ICD-10-CM | POA: Diagnosis not present

## 2023-06-28 DIAGNOSIS — K602 Anal fissure, unspecified: Secondary | ICD-10-CM | POA: Diagnosis not present

## 2023-08-09 DIAGNOSIS — E349 Endocrine disorder, unspecified: Secondary | ICD-10-CM | POA: Diagnosis not present

## 2023-08-12 DIAGNOSIS — R21 Rash and other nonspecific skin eruption: Secondary | ICD-10-CM | POA: Diagnosis not present

## 2023-08-15 DIAGNOSIS — W57XXXA Bitten or stung by nonvenomous insect and other nonvenomous arthropods, initial encounter: Secondary | ICD-10-CM | POA: Diagnosis not present

## 2023-08-15 DIAGNOSIS — B349 Viral infection, unspecified: Secondary | ICD-10-CM | POA: Diagnosis not present

## 2023-09-14 DIAGNOSIS — Z3009 Encounter for other general counseling and advice on contraception: Secondary | ICD-10-CM | POA: Diagnosis not present

## 2023-09-27 DIAGNOSIS — G43909 Migraine, unspecified, not intractable, without status migrainosus: Secondary | ICD-10-CM | POA: Diagnosis not present

## 2023-09-27 DIAGNOSIS — J029 Acute pharyngitis, unspecified: Secondary | ICD-10-CM | POA: Diagnosis not present

## 2023-09-27 DIAGNOSIS — R52 Pain, unspecified: Secondary | ICD-10-CM | POA: Diagnosis not present

## 2023-09-27 DIAGNOSIS — Z20822 Contact with and (suspected) exposure to covid-19: Secondary | ICD-10-CM | POA: Diagnosis not present

## 2023-09-29 DIAGNOSIS — J029 Acute pharyngitis, unspecified: Secondary | ICD-10-CM | POA: Diagnosis not present

## 2023-09-29 DIAGNOSIS — R051 Acute cough: Secondary | ICD-10-CM | POA: Diagnosis not present

## 2023-09-29 DIAGNOSIS — R0602 Shortness of breath: Secondary | ICD-10-CM | POA: Diagnosis not present

## 2023-10-07 DIAGNOSIS — M79662 Pain in left lower leg: Secondary | ICD-10-CM | POA: Diagnosis not present

## 2023-10-11 IMAGING — DX DG LUMBAR SPINE 2-3V
3 series · 3 of 3 positions shown · non-contrast
Comparison: None Available.

CLINICAL DATA: Low back pain

EXAM:
LUMBAR SPINE - 2-3 VIEW

[dg lumbar spine 2-3 views (1 of 3)]
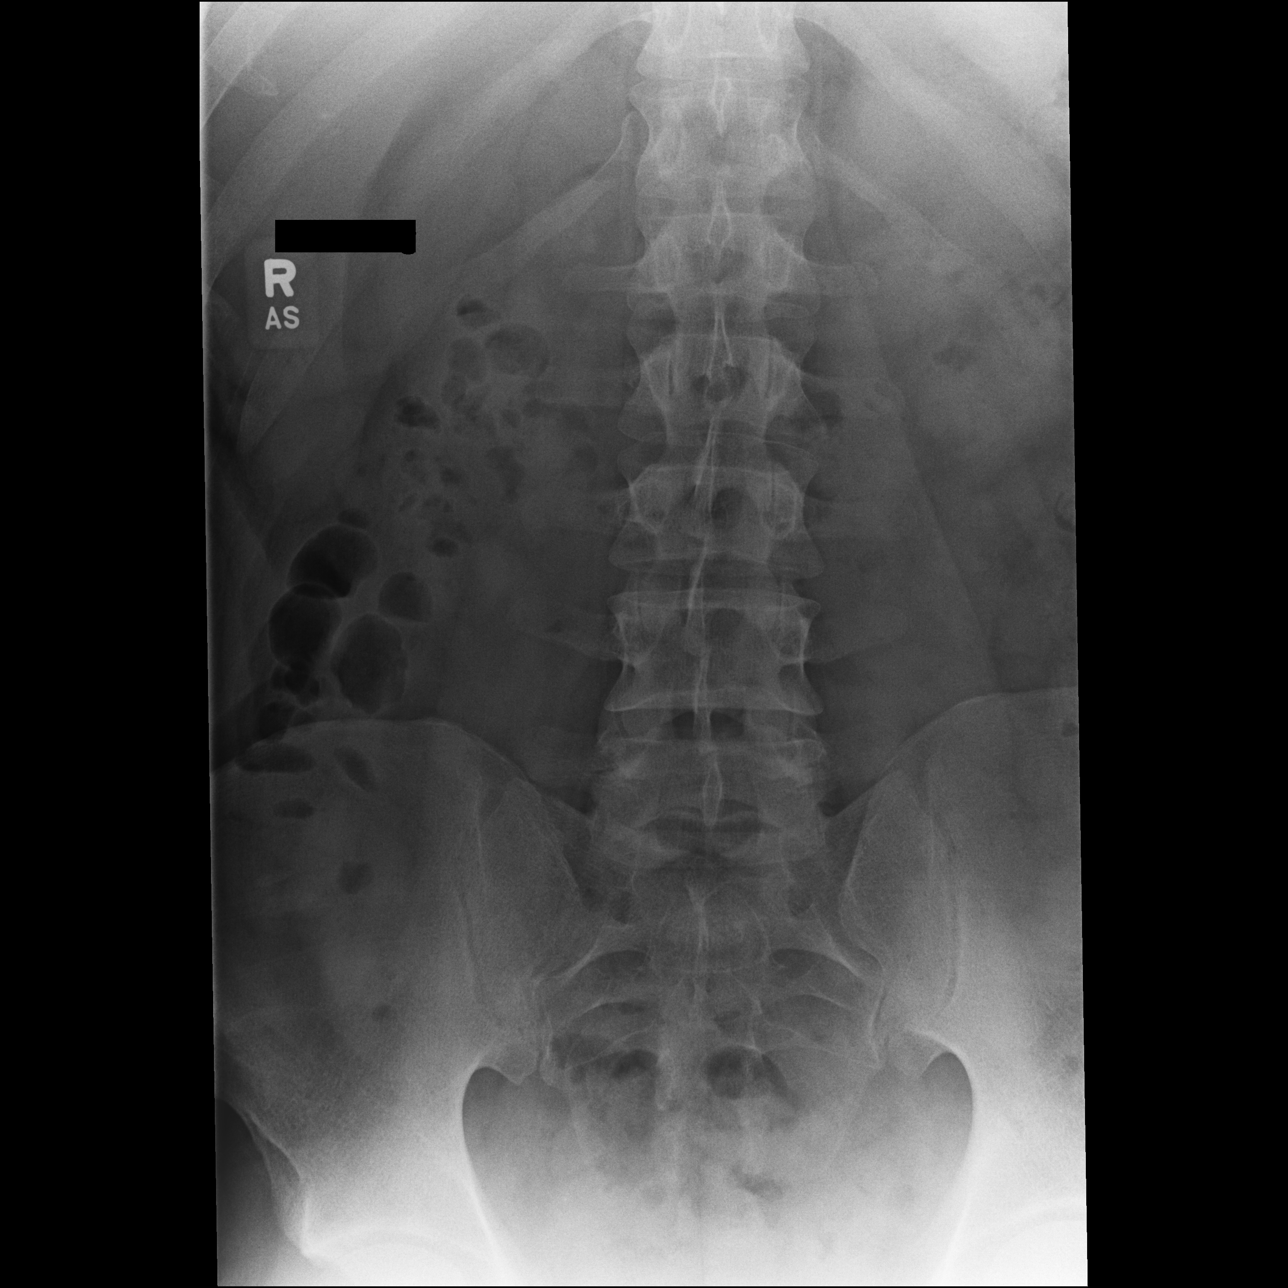

[dg lumbar spine 2-3 views (2 of 3)]
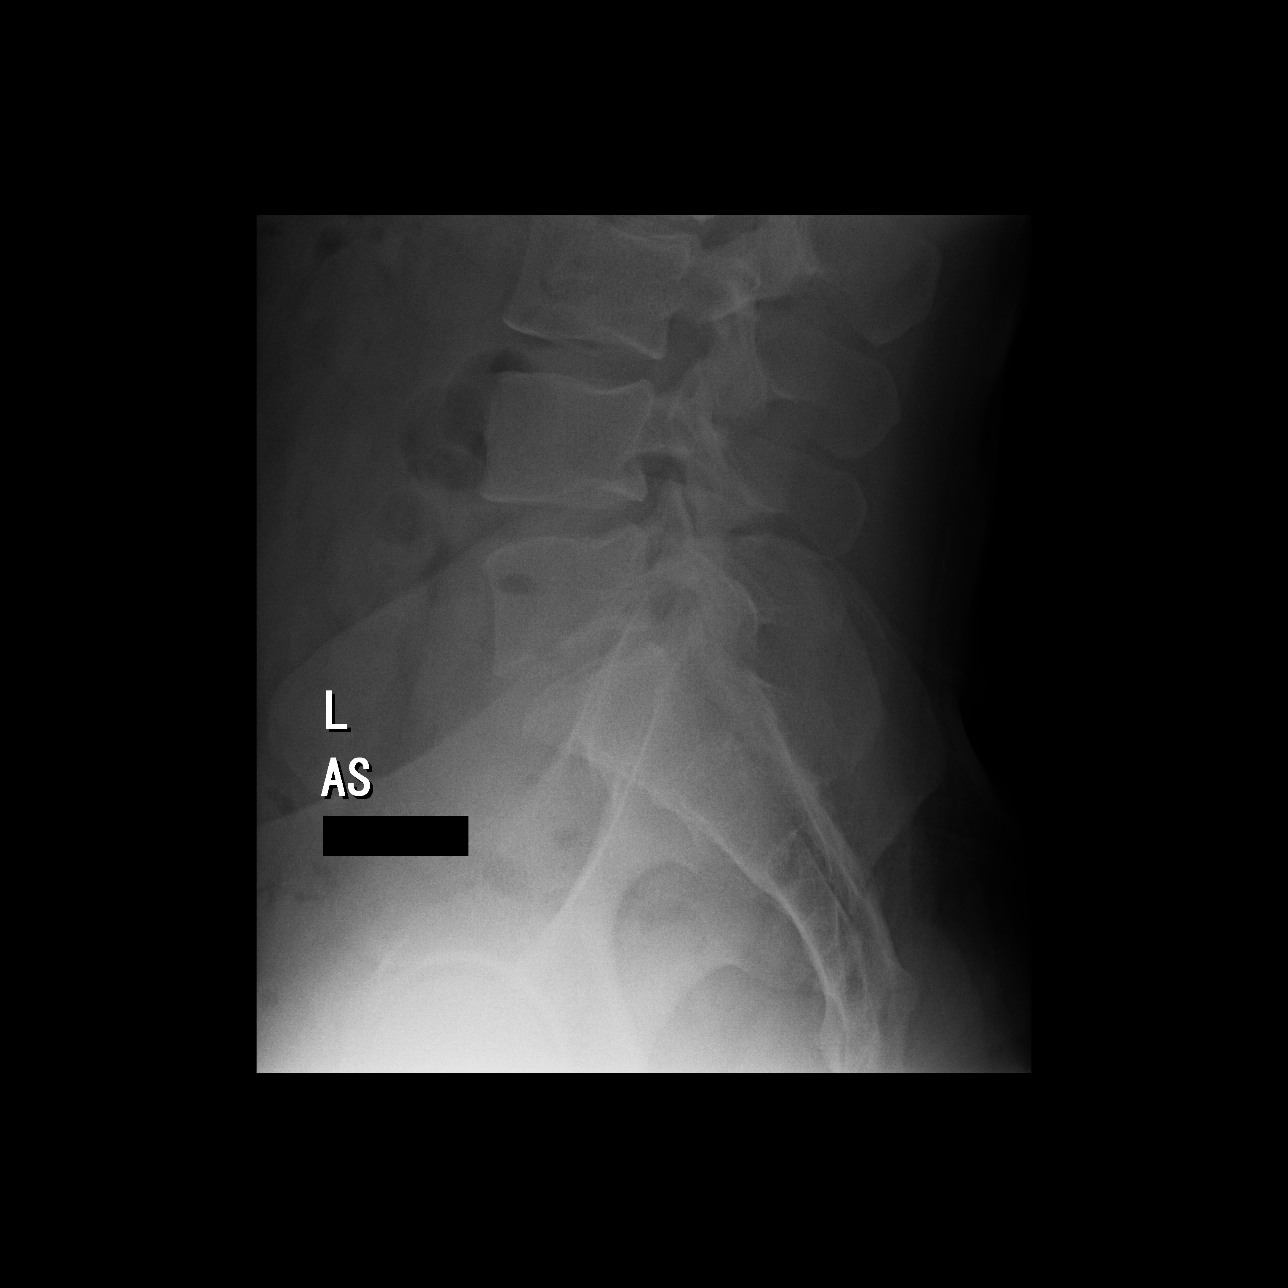

[dg lumbar spine 2-3 views (3 of 3)]
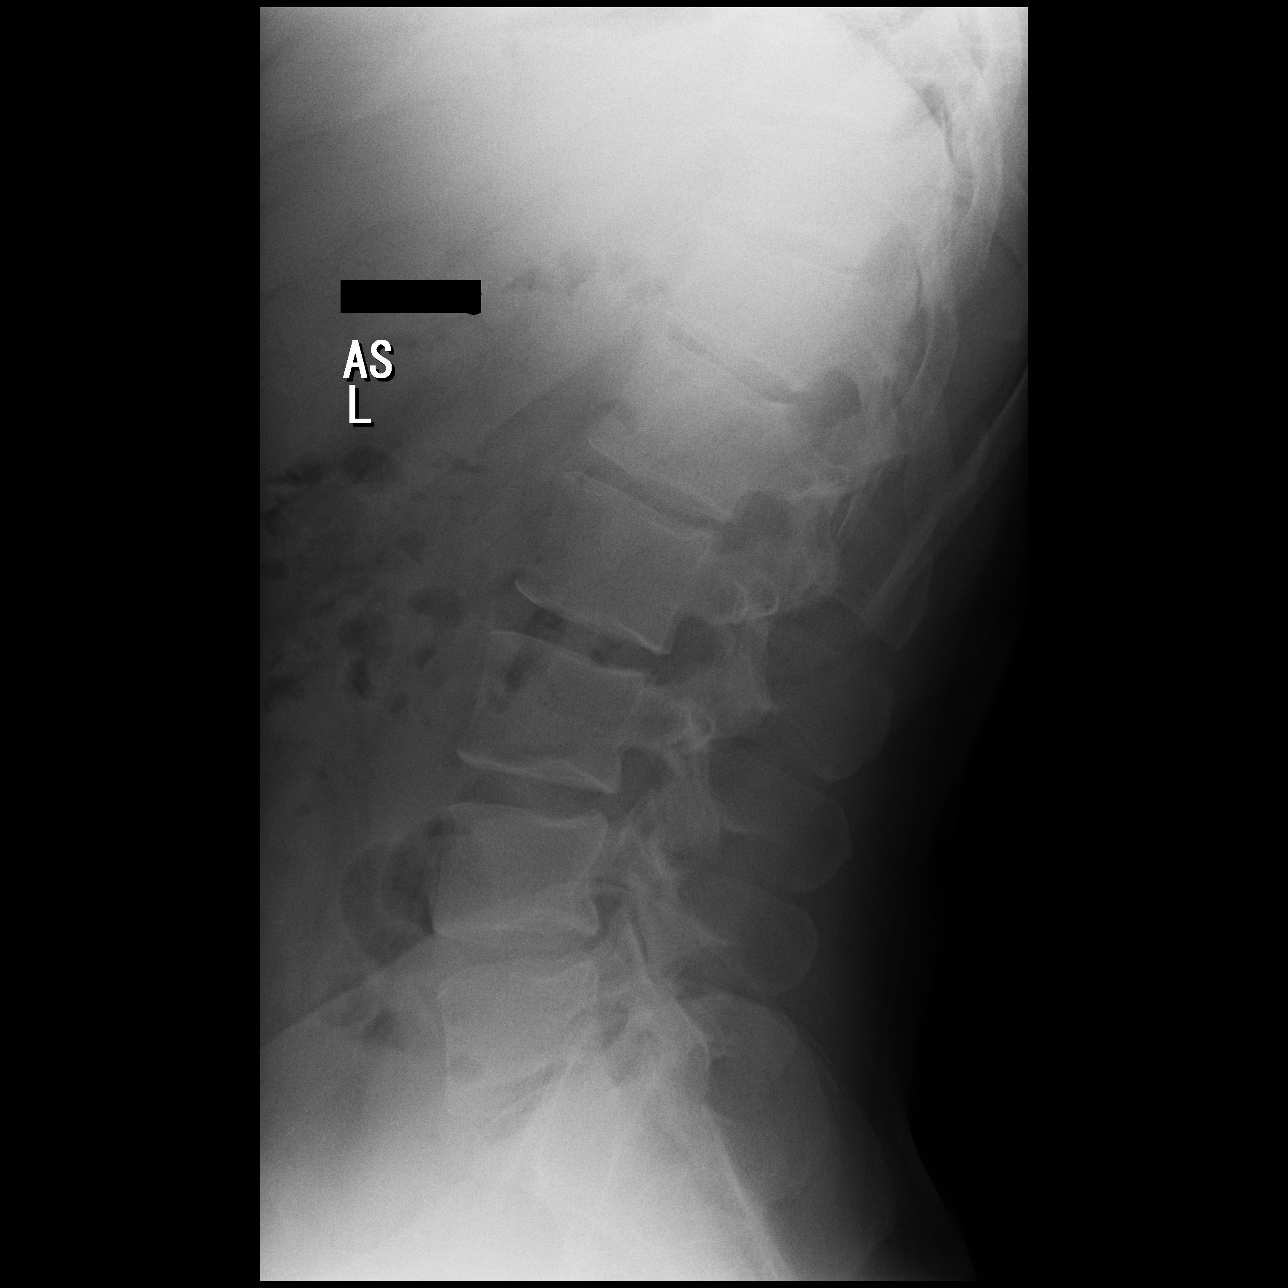

[3 of 3 positions shown; findings below may reference images not displayed]

FINDINGS: Lumbar vertebral body height and alignment are preserved without
fracture or spondylolisthesis. Mild intervertebral disc space
narrowing at L5-S1 and mild facet arthropathy in the lower lumbar
spine.
IMPRESSION: Mild degenerative changes with no acute fracture identified.

## 2023-10-24 DIAGNOSIS — M79662 Pain in left lower leg: Secondary | ICD-10-CM | POA: Diagnosis not present

## 2023-10-26 DIAGNOSIS — M79662 Pain in left lower leg: Secondary | ICD-10-CM | POA: Diagnosis not present

## 2023-10-27 DIAGNOSIS — M79662 Pain in left lower leg: Secondary | ICD-10-CM | POA: Diagnosis not present

## 2023-11-28 DIAGNOSIS — B009 Herpesviral infection, unspecified: Secondary | ICD-10-CM | POA: Diagnosis not present

## 2024-01-16 DIAGNOSIS — G43909 Migraine, unspecified, not intractable, without status migrainosus: Secondary | ICD-10-CM | POA: Diagnosis not present

## 2024-01-16 DIAGNOSIS — R051 Acute cough: Secondary | ICD-10-CM | POA: Diagnosis not present

## 2024-01-26 DIAGNOSIS — B009 Herpesviral infection, unspecified: Secondary | ICD-10-CM | POA: Diagnosis not present

## 2024-03-05 ENCOUNTER — Ambulatory Visit: Admitting: Family Medicine

## 2024-03-05 ENCOUNTER — Other Ambulatory Visit: Payer: Self-pay

## 2024-03-05 ENCOUNTER — Encounter: Payer: Self-pay | Admitting: Family Medicine

## 2024-03-05 VITALS — BP 151/85 | Ht 76.0 in | Wt 244.0 lb

## 2024-03-05 DIAGNOSIS — G8929 Other chronic pain: Secondary | ICD-10-CM

## 2024-03-05 DIAGNOSIS — L853 Xerosis cutis: Secondary | ICD-10-CM

## 2024-03-05 DIAGNOSIS — R030 Elevated blood-pressure reading, without diagnosis of hypertension: Secondary | ICD-10-CM

## 2024-03-05 DIAGNOSIS — M722 Plantar fascial fibromatosis: Secondary | ICD-10-CM | POA: Diagnosis not present

## 2024-03-05 DIAGNOSIS — M79605 Pain in left leg: Secondary | ICD-10-CM

## 2024-03-05 DIAGNOSIS — M7651 Patellar tendinitis, right knee: Secondary | ICD-10-CM | POA: Insufficient documentation

## 2024-03-05 MED ORDER — NITROGLYCERIN 0.2 MG/HR TD PT24: MEDICATED_PATCH | TRANSDERMAL | 1 refills | Status: AC

## 2024-03-05 NOTE — Assessment & Plan Note (Signed)
 Acute on chronic right anterior knee pain with [facility] ultrasound today showing signs of patellar tendinopathy with calcific tendinosis  Plan: -Ultrasound findings reviewed and detailed.  Diagnosis and treatment discussed -Rx topical nitroglycerin 0.2 mg patch apply every 24 hours, change daily.  He has done well with this in the past for his Achilles.  No prior issues with headaches or allergies to adhesive.  Explained how to take medication and common side effects. -Home exercise program with eccentric exercises provided.  Could consider formal physical therapy in the future -He is interested in a knee sleeve, was fitted with body helix knee sleeve today for added support and compression -Can use Tylenol or Voltaren gel as needed -Follow-up 6 weeks for reevaluation, sooner as needed.  Could consider trial of shockwave therapy if not improving with topical nitroglycerin -He is also requesting a letter on his behalf addressed to the veterans administration.  It is my opinion that his right knee pain and patellar tendinopathy is likely the result of altered gait/loading of the knee related to his underlying bilateral plantar fasciitis, as well as his chronic left lower leg issues including prior quadriceps muscle tear, prior calf tear, prior Achilles tendinopathy.  I will write a letter on his behalf that I will send to him through MyChart

## 2024-03-05 NOTE — Progress Notes (Signed)
 DATE OF VISIT: 03/05/2024        Tyler Howe DOB: 07/15/1986 MRN: [id number]  CC:  Rt knee pain  History of present Illness: Tyler Howe is a 38 y.o. male who presents for Rt knee pain Patient following with Dr. Margaretha Sheffield in our office for chronic left lower leg pain -Last seen by Dr. 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  He was later diagnosed with patellar tendinitis by Dr. Margaretha Sheffield.  Also noted to have chronic left Achilles and calf pain.  Prior history of partial calf tear.  Also with underlying bilateral plantar fasciitis  Recently has been experiencing increasing pain in the right anterior knee Notes pain near the tibial tubercle Denies any swelling Does note some clicking, denies any locking, catching, instability Has been intermittent pain for quite some time but is now more noticeable, especially since he has been having ongoing issues with his left leg [facility].  He was recently fitted with custom orthotics from [facility] to help with his leg pain is bilateral plantar fasciitis He has never had specific evaluation for the right knee He is requesting a letter to be addressed on his behalf to the [employer] that his right knee issues are related to his years of service and prior issues  Medications:  Outpatient Encounter Medications as of 03/05/2024  Medication Sig   clomiPHENE (CLOMID) 50 MG tablet Take 50 mg by mouth daily.   nitroGLYCERIN (NITRODUR - DOSED IN MG/24 HR) 0.2 mg/hr patch Use 1/4 patch daily to the affected area.   diclofenac Sodium (VOLTAREN) 1 % GEL APPLY 4 GRAMS TO AFFECTED AREA TWICE A DAY FOR FOOT PAIN-- APPLY TO PAINFUL AREAS OF BOTH FEET   [DISCONTINUED] AMBULATORY NON FORMULARY MEDICATION Diltiazem 2%  Apply rectally three times a day for 6-8 weeks (Patient not taking: Reported on  12/10/2022)   [DISCONTINUED] nystatin-triamcinolone (MYCOLOG II) cream Apply a small amount to outside of rectum twice daily x 2 weeks (Patient not taking: Reported on 03/22/2023)   No facility-administered encounter medications on file as of 03/05/2024.    Allergies: has no known allergies.  Physical Examination: Vitals: BP (!) 151/85   Ht 6\' 4"  (1.93 m)   Wt 244 lb (110.7 kg)   BMI 29.70 kg/m  GENERAL:  Tyler Howe is a 38 y.o. male appearing their stated age, alert and oriented x 3, in no apparent distress.  SKIN: Small area of dry, scaly skin noted along bilateral knees.  Examination of extensor surfaces of elbows were normal in appearance.  No other rashes or lesions appreciated MSK: Knee: Right knee without swelling or effusion.  Full range of motion with mild pain at terminal flexion.  Tender palpation over the tibial tubercle and distal patellar tendon.  No tenderness over the patella or proximal patellar tendon.  No palpable defects.  No medial or lateral joint line tenderness.  Negative Murray, negative Lachman, negative varus/valgus stress. Left knee with full range of motion without pain or weakness.  No tenderness over the patellar tendon Noted to have very tight Achilles bilaterally Has moderate longitudinal arches, bilateral overpronation left greater than right Walking without a limp NEURO: sensation intact to light touch VASC: pulses 2+ and symmetric DP/PT bilaterally, no edema  Radiology: Limited MSK ultrasound right knee Date: 03/05/24 Indication: Right knee pain rule out patellar tendinopathy  Findings: -Patellar tendon with normal proximal appearance at the origin of the inferior pole of the patella.  Slight thickening and hypoechoic change noted along the distal fibers at insertion of the tibial tubercle.  Does have scattered areas of internal calcification along the distal tendon consistent with tendinopathy.  No increased Doppler flow or  neovascularization  Impression: -Right distal patellar tendinopathy with associated calcific tendinosis Images and interpretation completed by Darene Lamer, DO  Assessment & Plan Patellar tendonitis of right knee Acute on chronic right anterior knee pain with MSK ultrasound today showing signs of patellar tendinopathy with calcific tendinosis  Plan: -Ultrasound findings reviewed and detailed.  Diagnosis and treatment discussed -Rx topical nitroglycerin 0.2 mg patch apply every 24 hours, change daily.  He has done well with this in the past for his Achilles.  No prior issues with headaches or allergies to adhesive.  Explained how to take medication and common side effects. -Home exercise program with eccentric exercises provided.  Could consider formal physical therapy in the future -He is interested in a knee sleeve, was fitted with body helix knee sleeve today for added support and compression -Can use Tylenol or Voltaren gel as needed -Follow-up 6 weeks for reevaluation, sooner as needed.  Could consider trial of shockwave therapy if not improving with topical nitroglycerin -He is also requesting a letter on his behalf addressed to the veterans administration.  It is my opinion that his right knee pain and patellar tendinopathy is likely the result of altered gait/loading of the knee related to his underlying bilateral plantar fasciitis, as well as his chronic left lower leg issues including prior quadriceps muscle tear, prior calf tear, prior Achilles tendinopathy.  I will write a letter on his behalf that I will send to him through MyChart Bilateral plantar fasciitis Bilateral plantar fasciitis, recently received new custom orthotics from [facility]  Plan: -Custom orthotics were examined by me today.  These are semirigid orthotics and appear to provide good support and stability.  He should help his foot pain, but may also help with somatic cushioning to unload his knees -He will  continue with his current treatment plan and physical therapy as directed through the [place] -Could consider trial of shockwave therapy for his plantar fasciitis in the future if symptoms persist Left leg pain Chronic left lower leg pain with prior history of quadriceps muscle tear, calf tear, Achilles tendinopathy, as well as underlying plantar fasciitis  Plan: -Will continue his current treatment plan and physical therapy as directed through the [place] Elevated blood pressure reading Noted to have elevated blood pressure today of 151/85, no documented history of hypertension  Plan: -Advised to follow-up with his PCP regarding blood pressure elevations Dry skin Noted to have areas of dry skin over patella bilaterally.  Areas are slightly lichenified, no prior history of skin issues or psoriasis.  No family history. -Given underlying musculoskeletal issues, question whether this could be signs of mild psoriasis  Plan: -Recommend he discuss skin findings with PCP and consider possible evaluation for psoriasis/psoriatic arthritis   Patient expressed understanding & agreement with above.  Encounter Diagnoses  Name Primary?   Patellar tendonitis of right knee Yes   Bilateral plantar fasciitis    Left leg pain    Elevated blood pressure reading    Dry skin     Orders Placed This Encounter  Procedures   [place] LIMITED JOINT SPACE STRUCTURES LOW RIGHT

## 2024-03-05 NOTE — Patient Instructions (Signed)

## 2024-03-15 DIAGNOSIS — N509 Disorder of male genital organs, unspecified: Secondary | ICD-10-CM | POA: Diagnosis not present

## 2024-05-09 DIAGNOSIS — M79671 Pain in right foot: Secondary | ICD-10-CM | POA: Diagnosis not present

## 2024-05-09 DIAGNOSIS — M722 Plantar fascial fibromatosis: Secondary | ICD-10-CM | POA: Diagnosis not present

## 2024-05-09 DIAGNOSIS — M79672 Pain in left foot: Secondary | ICD-10-CM | POA: Diagnosis not present

## 2024-05-09 DIAGNOSIS — Q6671 Congenital pes cavus, right foot: Secondary | ICD-10-CM | POA: Diagnosis not present

## 2024-05-16 DIAGNOSIS — E349 Endocrine disorder, unspecified: Secondary | ICD-10-CM | POA: Diagnosis not present

## 2024-05-21 DIAGNOSIS — M79604 Pain in right leg: Secondary | ICD-10-CM | POA: Diagnosis not present

## 2024-05-30 DIAGNOSIS — F4320 Adjustment disorder, unspecified: Secondary | ICD-10-CM | POA: Diagnosis not present

## 2024-06-21 DIAGNOSIS — F4320 Adjustment disorder, unspecified: Secondary | ICD-10-CM | POA: Diagnosis not present

## 2024-06-28 DIAGNOSIS — F4320 Adjustment disorder, unspecified: Secondary | ICD-10-CM | POA: Diagnosis not present

## 2024-07-11 DIAGNOSIS — F4320 Adjustment disorder, unspecified: Secondary | ICD-10-CM | POA: Diagnosis not present

## 2024-07-25 DIAGNOSIS — F4320 Adjustment disorder, unspecified: Secondary | ICD-10-CM | POA: Diagnosis not present

## 2024-08-08 DIAGNOSIS — K209 Esophagitis, unspecified without bleeding: Secondary | ICD-10-CM | POA: Diagnosis not present

## 2024-08-08 DIAGNOSIS — Z719 Counseling, unspecified: Secondary | ICD-10-CM | POA: Diagnosis not present

## 2024-08-08 DIAGNOSIS — K5900 Constipation, unspecified: Secondary | ICD-10-CM | POA: Diagnosis not present

## 2024-08-08 DIAGNOSIS — R079 Chest pain, unspecified: Secondary | ICD-10-CM | POA: Diagnosis not present

## 2024-08-09 DIAGNOSIS — F4320 Adjustment disorder, unspecified: Secondary | ICD-10-CM | POA: Diagnosis not present

## 2024-08-16 DIAGNOSIS — F4320 Adjustment disorder, unspecified: Secondary | ICD-10-CM | POA: Diagnosis not present

## 2024-09-12 DIAGNOSIS — F4320 Adjustment disorder, unspecified: Secondary | ICD-10-CM | POA: Diagnosis not present

## 2024-09-14 DIAGNOSIS — M2011 Hallux valgus (acquired), right foot: Secondary | ICD-10-CM | POA: Diagnosis not present

## 2024-09-20 ENCOUNTER — Ambulatory Visit: Admitting: Family Medicine

## 2024-09-20 ENCOUNTER — Encounter: Payer: Self-pay | Admitting: Family Medicine

## 2024-09-20 VITALS — BP 138/80 | Ht 76.0 in | Wt 238.0 lb

## 2024-09-20 DIAGNOSIS — G8929 Other chronic pain: Secondary | ICD-10-CM | POA: Diagnosis not present

## 2024-09-20 DIAGNOSIS — M2041 Other hammer toe(s) (acquired), right foot: Secondary | ICD-10-CM

## 2024-09-20 DIAGNOSIS — M545 Low back pain, unspecified: Secondary | ICD-10-CM

## 2024-09-20 DIAGNOSIS — M5416 Radiculopathy, lumbar region: Secondary | ICD-10-CM | POA: Diagnosis not present

## 2024-09-20 DIAGNOSIS — Q6671 Congenital pes cavus, right foot: Secondary | ICD-10-CM

## 2024-09-20 DIAGNOSIS — Q6672 Congenital pes cavus, left foot: Secondary | ICD-10-CM

## 2024-09-20 NOTE — Progress Notes (Signed)
 DATE OF VISIT: 09/20/2024        Tyler Howe DOB: 06-Aug-1986 MRN: [id number]  Discussed the use of AI scribe software for clinical note transcription with the patient, who gave verbal consent to proceed.  History of Present Illness Tyler Howe is a 38 year old male with chronic low back pain and radiculopathy who presents for evaluation of persistent symptoms and management options.  He is following with Dr. Arvell in our practice who has recently retired Has also been following at the [facility] for some time   Chronic low back pain and radiculopathy - Chronic low back pain for 4-5 years, described as a constant squeezing sensation in the spine from the lower back downwards, with associated tightness -And radiates into the left leg associated numbness and cramping in the toes, particularly the left great toe - Pain is persistent and present 24/7, without significant worsening over the years - MRI in 2023 thru the [place] showed some disc narrowing; results described as inconclusive - Denies any changes in bowel or bladder - Denies lower extremity weakness - Receives treatment at [facility], including stretching, manipulation, needling, and TENS unit therapy - Initially attended weekly sessions, now less frequent due to insurance limitations - Chiropractic manipulation helps maintain condition without worsening - Seeking additional chiropractic and physical therapy visits through the [place] to manage symptoms and avoid surgery - He has not had any recent imaging.  Last imaging was in 2023 - Not taking any medications for this - Has never been evaluated by spine specialist  Musculoskeletal injuries and gait abnormalities - History of plantar fasciitis and pes cavus.  Has been told by some physicians in the past (which seem to be podiatrist) believed to contribute to back issues - History of Achilles tendon tear, ankle tendinitis, and quadriceps muscle  tear - Prior injuries have affected gait  Foot pain and orthotic use - Uses custom orthotics from [facility] - Tapes feet to manage foot pain when he is in the gym, which helps reduce knee pain - Orthotics help alleviate some symptoms - Has been told by podiatrist in the past that should have surgery to fix his pes cavus and hammertoes.  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  Wore a lot of heavy gear.  Injuries during this time as noted above, has been following with the [facility]  He is wondering if current back issues could be linked to his service, therefore potentially allowing him more disability benefits.  - Symptoms attributed to tight boots during military service     Medications:  Outpatient Encounter Medications as of 09/20/2024  Medication Sig   clomiPHENE (CLOMID) 50 MG tablet Take 50 mg by mouth daily.   diclofenac Sodium (VOLTAREN) 1 % GEL APPLY 4 GRAMS TO AFFECTED AREA TWICE A DAY FOR FOOT PAIN-- APPLY TO PAINFUL AREAS OF BOTH FEET   nitroGLYCERIN  (NITRODUR - DOSED IN MG/24 HR) 0.2 mg/hr patch Use 1/4 patch daily to the affected area.   No facility-administered encounter medications on file as of 09/20/2024.    Allergies: has no known allergies.  Physical Examination: Vitals: BP 138/80   Ht 6' 4 (1.93 m)   Wt 238 lb (108 kg)   BMI 28.97 kg/m  GENERAL:  Tyler Howe is a  38 y.o. male appearing their stated age, alert and oriented x 3, in no apparent distress.  SKIN: no rashes or lesions, skin clean, dry, intact MSK: Spine: Lumbar spine without any gross deformity.  No midline tenderness.  Bilateral diffuse paraspinal tenderness with associated hypertonia throughout bilateral paraspinal musculature extending to the lumbosacral junction.  Decreased forward flexion to 75 degrees with associated pain.   Slight decreased extension which does improve his pain.  Able to toe walk and heel walk.  Positive straight leg raise on the left, negative on the right.  Does have tight hamstrings bilaterally.  Lower extremity strength 5/5 throughout Foot/ankle: Bilateral pes cavus.  Left foot with hammering of toes 1, 2 and 3.  Right foot with hammering of toes 1 and 2 Walking with stiff, upright gait NEURO: Diminished sensation to light touch along the left lower extremity along the lateral lower leg, dorsum of the foot, first webspace, plantar aspect of the foot.  Otherwise sensation intact to light touch extremity bilaterally, DTR 2/4 Achilles and patella bilaterally VASC: pulses 2+ and symmetric DP and PT bilaterally, no edema  Radiology: MRI lumbar spine completed in 2023, images not available, only report as reviewed in patient message showing: INDICATION: Radiculopathy TECHNIQUE: MRI SPINE LUMBAR WO IV CONTRAST. FINDINGS: # Osseous structures: Vertebral body heights are maintained. No acute fracture. No pars defect. No destructive bony changes. # Alignment:No significant subluxation. # Conus medullaris/cauda equina: Normal. Conus terminates at T12-L1. # T12-L1: Preservation of disc height. No focal disc herniation, significant spinal canal or foraminal stenosis. # L1-L2: Preservation of disc height. No focal disc herniation, significant spinal canal or foraminal stenosis. # L2-L3: Preservation of disc height. No focal disc herniation, significant spinal canal or foraminal stenosis. # L3-L4: Preservation of disc height. No focal disc herniation, significant spinal canal or foraminal stenosis. # L4-L5: Preservation of disc height. No focal disc herniation, significant spinal canal or foraminal stenosis. Mild facet arthrosis. # L5-S1: Disc desiccation and mild-to-moderate loss of disc height. No focal disc herniation or significant stenosis. Mild facet arthrosis.  IMPRESSION: 1. Mild degenerative changes  lower lumbar spine. No focal disc herniation or definite nerve root impingement. 2. No acute bony abnormality.  Electronically Signed by: Elsie Dayhoff, MD on 09/24/2022 9:27 AM  L-spine x-ray 05/11/2022 personally reviewed and interpreted by me today showing: - Disc space narrowing, most prominent at L5-S1 - Mild facet arthropathy - No other acute abnormalities Assessment & Plan Chronic low back pain with left lower extremity radiculopathy ongoing for several years Chronic low back pain with left lower extremity radiculopathy with associated left lower extremity sensory deficits and ongoing radicular symptoms. Likely due to underlying lumbar disc issues. Previous MRI showed mild disc narrowing, symptoms suggest possible progression.  - Previous imaging and visit notes reviewed in detail. - Recommend updated MRI of the lumbar spine to assess for changes, especially given ongoing neurologic deficits in his left lower extremity.  He will reach out to the [place] regarding updated imaging.  Due to current government shutdown, he is aware that there could potentially be some delays. - Consider EMG/nerve conduction study if MRI results are inconclusive. - Continue chiropractic care and physical therapy as he is doing - Discuss potential referral to a nonoperative spine specialist based on imaging results. - Could be candidate for trial of medication therapy in the future depending on MRI results - He will keep me updated of progress with the [facility].  I am happy to order updated imaging  if necessary.  He expressed understanding agreement  Bilateral plantar fasciitis with pes cavus and hammer toes Chronic bilateral plantar fasciitis with pes cavus and hammer toes. Managed with orthotics, taping, and physical therapy.  He states he does not prefer surgery due to risks and family experiences.  States that injury has been service-connected through [employer]. - Continue use of custom orthotics  and taping. - Schedule annual orthotic fitting in December as he does annually - Continue physical therapy focusing on stability and calf management. - Discuss surgical options only if conservative management fails. - Advised there is likely some length that some of his back symptoms are worsened from his foot issues, but that would not account for his radicular symptoms.  More information is needed before a link can be established     Patient expressed understanding & agreement with above.  Encounter Diagnoses  Name Primary?   Chronic bilateral low back pain without sciatica Yes   Chronic left-sided lumbar radiculopathy    Hammer toes, bilateral    Pes cavus of both feet     No orders of the defined types were placed in this encounter.     Contains text generated by [employer].

## 2024-09-21 NOTE — Patient Instructions (Signed)
 Tyler Howe

## 2024-10-04 DIAGNOSIS — F4320 Adjustment disorder, unspecified: Secondary | ICD-10-CM | POA: Diagnosis not present
